# Patient Record
Sex: Female | Born: 1950 | Race: White | Hispanic: No | Marital: Married | Smoking: Never smoker
Health system: Southern US, Community
[De-identification: ages and names within clinical notes are randomized; demographics above are authoritative.]

## PROBLEM LIST (undated history)

## (undated) DIAGNOSIS — G4733 Obstructive sleep apnea (adult) (pediatric): Secondary | ICD-10-CM

## (undated) DIAGNOSIS — F419 Anxiety disorder, unspecified: Secondary | ICD-10-CM

## (undated) DIAGNOSIS — I10 Essential (primary) hypertension: Secondary | ICD-10-CM

## (undated) DIAGNOSIS — K5900 Constipation, unspecified: Secondary | ICD-10-CM

## (undated) DIAGNOSIS — Z87448 Personal history of other diseases of urinary system: Secondary | ICD-10-CM

## (undated) DIAGNOSIS — E119 Type 2 diabetes mellitus without complications: Secondary | ICD-10-CM

## (undated) DIAGNOSIS — Z87442 Personal history of urinary calculi: Secondary | ICD-10-CM

## (undated) DIAGNOSIS — G629 Polyneuropathy, unspecified: Secondary | ICD-10-CM

## (undated) DIAGNOSIS — I219 Acute myocardial infarction, unspecified: Secondary | ICD-10-CM

## (undated) DIAGNOSIS — J45909 Unspecified asthma, uncomplicated: Secondary | ICD-10-CM

## (undated) DIAGNOSIS — J309 Allergic rhinitis, unspecified: Secondary | ICD-10-CM

## (undated) DIAGNOSIS — E669 Obesity, unspecified: Secondary | ICD-10-CM

## (undated) DIAGNOSIS — D649 Anemia, unspecified: Secondary | ICD-10-CM

## (undated) DIAGNOSIS — E785 Hyperlipidemia, unspecified: Secondary | ICD-10-CM

## (undated) HISTORY — PX: EYE SURGERY: SHX253

## (undated) HISTORY — DX: Hyperlipidemia, unspecified: E78.5

## (undated) HISTORY — PX: CORONARY ANGIOPLASTY: SHX604

## (undated) HISTORY — DX: Allergic rhinitis, unspecified: J30.9

## (undated) HISTORY — DX: Anxiety disorder, unspecified: F41.9

## (undated) HISTORY — DX: Obesity, unspecified: E66.9

## (undated) HISTORY — DX: Obstructive sleep apnea (adult) (pediatric): G47.33

---

## 1998-04-15 ENCOUNTER — Emergency Department (HOSPITAL_COMMUNITY): Admission: EM | Admit: 1998-04-15 | Discharge: 1998-04-15 | Payer: Self-pay | Admitting: Emergency Medicine

## 2000-07-05 ENCOUNTER — Encounter: Admission: RE | Admit: 2000-07-05 | Discharge: 2000-10-03 | Payer: Self-pay | Admitting: Internal Medicine

## 2000-09-01 ENCOUNTER — Emergency Department (HOSPITAL_COMMUNITY): Admission: EM | Admit: 2000-09-01 | Discharge: 2000-09-01 | Payer: Self-pay

## 2007-09-07 ENCOUNTER — Emergency Department (HOSPITAL_COMMUNITY): Admission: EM | Admit: 2007-09-07 | Discharge: 2007-09-07 | Payer: Self-pay | Admitting: Emergency Medicine

## 2009-10-26 ENCOUNTER — Emergency Department (HOSPITAL_COMMUNITY): Admission: EM | Admit: 2009-10-26 | Discharge: 2009-10-26 | Payer: Self-pay | Admitting: Emergency Medicine

## 2009-12-07 ENCOUNTER — Inpatient Hospital Stay (HOSPITAL_COMMUNITY): Admission: RE | Admit: 2009-12-07 | Discharge: 2009-12-11 | Payer: Self-pay | Admitting: Interventional Cardiology

## 2009-12-29 ENCOUNTER — Observation Stay (HOSPITAL_COMMUNITY)
Admission: RE | Admit: 2009-12-29 | Discharge: 2009-12-30 | Payer: Self-pay | Source: Home / Self Care | Admitting: Interventional Cardiology

## 2010-03-23 ENCOUNTER — Encounter (HOSPITAL_COMMUNITY)
Admission: RE | Admit: 2010-03-23 | Discharge: 2010-05-16 | Payer: Self-pay | Source: Home / Self Care | Attending: Interventional Cardiology | Admitting: Interventional Cardiology

## 2010-04-02 ENCOUNTER — Inpatient Hospital Stay (HOSPITAL_COMMUNITY)
Admission: EM | Admit: 2010-04-02 | Discharge: 2010-04-03 | Payer: Self-pay | Source: Home / Self Care | Attending: Interventional Cardiology | Admitting: Interventional Cardiology

## 2010-04-21 ENCOUNTER — Encounter
Admission: RE | Admit: 2010-04-21 | Discharge: 2010-04-21 | Payer: Self-pay | Source: Home / Self Care | Attending: Internal Medicine | Admitting: Internal Medicine

## 2010-05-01 LAB — GLUCOSE, CAPILLARY
Glucose-Capillary: 204 mg/dL — ABNORMAL HIGH (ref 70–99)
Glucose-Capillary: 145 mg/dL — ABNORMAL HIGH (ref 70–99)

## 2010-05-10 LAB — GLUCOSE, CAPILLARY: Glucose-Capillary: 156 mg/dL — ABNORMAL HIGH (ref 70–99)

## 2010-05-17 ENCOUNTER — Encounter (HOSPITAL_COMMUNITY): Payer: Self-pay

## 2010-05-19 ENCOUNTER — Encounter (HOSPITAL_COMMUNITY): Payer: Self-pay

## 2010-05-22 ENCOUNTER — Other Ambulatory Visit: Payer: Self-pay

## 2010-05-22 ENCOUNTER — Encounter (HOSPITAL_COMMUNITY): Payer: Self-pay | Attending: Interventional Cardiology

## 2010-05-22 DIAGNOSIS — G4733 Obstructive sleep apnea (adult) (pediatric): Secondary | ICD-10-CM | POA: Insufficient documentation

## 2010-05-22 DIAGNOSIS — Z5189 Encounter for other specified aftercare: Secondary | ICD-10-CM | POA: Insufficient documentation

## 2010-05-22 DIAGNOSIS — I2510 Atherosclerotic heart disease of native coronary artery without angina pectoris: Secondary | ICD-10-CM | POA: Insufficient documentation

## 2010-05-22 DIAGNOSIS — Z9861 Coronary angioplasty status: Secondary | ICD-10-CM | POA: Insufficient documentation

## 2010-05-22 DIAGNOSIS — E119 Type 2 diabetes mellitus without complications: Secondary | ICD-10-CM | POA: Insufficient documentation

## 2010-05-22 DIAGNOSIS — I10 Essential (primary) hypertension: Secondary | ICD-10-CM | POA: Insufficient documentation

## 2010-05-22 DIAGNOSIS — E785 Hyperlipidemia, unspecified: Secondary | ICD-10-CM | POA: Insufficient documentation

## 2010-05-22 DIAGNOSIS — E669 Obesity, unspecified: Secondary | ICD-10-CM | POA: Insufficient documentation

## 2010-05-22 DIAGNOSIS — Z79899 Other long term (current) drug therapy: Secondary | ICD-10-CM | POA: Insufficient documentation

## 2010-05-22 DIAGNOSIS — I252 Old myocardial infarction: Secondary | ICD-10-CM | POA: Insufficient documentation

## 2010-05-23 LAB — GLUCOSE, CAPILLARY: Glucose-Capillary: 229 mg/dL — ABNORMAL HIGH (ref 70–99)

## 2010-05-24 ENCOUNTER — Encounter (HOSPITAL_COMMUNITY): Payer: Self-pay

## 2010-05-26 ENCOUNTER — Encounter (HOSPITAL_COMMUNITY): Payer: Self-pay

## 2010-05-27 NOTE — H&P (Signed)
Kimberly Hardin, Kimberly Hardin NO.:  [id number]  MEDICAL RECORD NO.:  [id number]          PATIENT TYPE:  INP  LOCATION:  2028                         FACILITY:  [facility]  PHYSICIAN:  Sarajane Marek, MD     DATE OF BIRTH:  23-Nov-1950  DATE OF ADMISSION:  04/02/2010 DATE OF DISCHARGE:                             HISTORY & PHYSICAL   PRIMARY CARDIOLOGIST:  Lyn Records, MD  PRIMARY PHYSICIAN:  Redge Gainer. Perini, MD  CHIEF COMPLAINT:  Chest pain.  HISTORY OF PRESENT ILLNESS:  Kimberly Hardin is a 60 year old female with history of coronary artery disease status post PCI x2 as detailed below, presenting with chest pain and palpitations this afternoon.  She reports that the sensation was like an esophageal spasm, though this episode lasted longer, which caused her to panic and brought the pain back after initially resolving with nitroglycerin.  She did not have pressure shortness of breath or nausea as she had had in August with her prior heart attack.  Onset occurred after eating and did resolve initially with nitroglycerin, though soon returned in association with anxiety.  She then took more nitroglycerin as well as Ativan and did have relief, though came to the hospital for further evaluation due to concern that this may be angina.  She also reports mild reactive airway disease for 3 days that she feels is secondary to her neighbor burning leaves and has required her to use her Claritin and albuterol inhalers several times this week.  In the emergency department, she currently has received no medications as she took nitroglycerin and aspirin at home, she was about to be started on heparin drip.  ALLERGIES: 1. PENICILLIN. 2. PREDNISONE. 3. DARVON. 4. BETADINE.  MEDICATIONS: 1. Coenzyme Q10 200 mg daily. 2. Proventil inhaler 1 puff q.6 h as needed. 3. Phenergan 25 mg tablet q.6 h p.r.n. 4. NovoLog insulin 6 units subcu q.a.c. 5. Aspirin 325 mg daily. 6. Coreg 6.25 mg  p.o. b.i.d. 7. Lantus 30 units subcu nightly. 8. Metformin 1000 mg p.o. b.i.d. 9. Multivitamin 1 tablet daily. 10.Plavix 75 mg daily. 11.Nitrostat p.r.n. 12.Xanax 0.25 mg p.r.n. 13.Claritin 10 mg p.r.n.  PAST MEDICAL HISTORY.: 1. Coronary artery disease.     a.     Anterior ST elevation MI, August 2011, status post 2.75 x 18-      mm Vision bare-metal sent to the proximal LAD, ejection fraction      at that time 50% with mid and distal anterior hypokinesis     b.     September 2011, 3.0 x 18-mm Promus drug-eluting stent to the      mid left circumflex 2..  Diabetes mellitus. 1. Hypertension. 2. Hyperlipidemia. 3. Panic disorder.  SOCIAL HISTORY:  The patient lives with her husband and denies alcohol or tobacco use.  FAMILY HISTORY:  Her mother is deceased at age 68.  She has a history of carotid artery disease.  Her father is 25 with coronary disease status post bypass.  REVIEW OF SYSTEMS:  Complete review of systems was performed and was negative except as noted in HPI.  PHYSICAL EXAMINATION:  VITAL  SIGNS:  Temperature is 98.4, heart rate 79, respiratory rate 18, blood pressure 150/85, oxygen saturation 98% on room air. GENERAL:  This is a very pleasant obese female in no acute distress. EYES:  Extraocular muscles intact.  Sclerae are clear. ENT: Oropharynx is clear.  Nares are clear.  Mucous membranes are moist. NECK:  Supple without lymphadenopathy, no thyromegaly, bruit or JVD. CARDIOVASCULAR:  Heart rate is regular with normal S1 and S2.  No S3 or S4.  There is a 3/6 systolic murmur at the right and left sternal borders, unable to palpate PMI secondary to body habitus.  Pulses are 2+ and equal without bruits x4 extremities. RESPIRATORY:  Clear bilaterally without increased work of breathing.  No wheezes, rhonchi, or rales.  No tachypnea. SKIN::  There is mild venous stasis changes bilaterally in the anterior tibial surface. ABDOMEN:  Soft and nontender with normal  bowel sounds.  Exam is limited by obesity. EXTREMITIES:  There is 1+ symmetric bilateral lower extremity edema below the knee.  No clubbing or cyanosis. MUSCULOSKELETAL:  No joint deformity. NEUROLOGIC:  The patient is alert and oriented x3 with cranial nerves grossly intact. PSYCHIATRIC:  The patient is anxious, but otherwise has an appropriate disposition.  Chest x-ray is pending at this time.  EKG demonstrates normal sinus rhythm at a rate of 77 with a prior septal infarct.  There are normal QRS and QTc intervals.  There is evidence of inferolateral ST depression and T-wave inversions.  Prior anterior T- wave inversions have resolved.  LABORATORY DATA:  Has been reviewed.  Pertinent data includes hemoglobin of 11.7, platelets of 217.  Sodium 142, potassium 4.3, creatinine 0.81, glucose is 263.  INR is 0.89, PTT is 27.  CK 104, CK-MB 3.0, troponin 0.12.  ASSESSMENT: 1. Chest pain. 2. Elevated troponin, consider acute coronary syndrome. 3. Hypertension. 4. Diabetes. 5. Hyperlipidemia. 6. Anxiety disorder.  PLAN: 1. Chest pain:  The patient will be admitted for ischemic evaluation.     She is currently chest pain free.  We will continue her home     aspirin, Plavix, Coreg and pravastatin and start a heparin drip.     Nitroglycerin not indicated at this time as chest pain is resolved.     We will continue p.r.n. nitroglycerin as needed.  We will follow     serial cardiac biomarkers, monitor her on telemetry and follow     serial EKGs.  In the morning, we will obtain fasting lipids, a TSH,     and a BNP.  She will be kept n.p.o. after midnight for possible     procedures pending the outcome of cardiac biomarkers.  We will     follow strict Is and Os, daily weights, maintain potassium greater     than 4, magnesium greater than 2.  We will also obtain a D-dimer to     evaluate for pulmonary embolus.  Also consider esophageal spasm or     anxiety disorder as the etiology of  chest discomfort.  We will     provide p.r.n. Xanax as well as H2 blocker. 2. Diabetes mellitus:  We will check hemoglobin A1c.  Give half dose     insulin to night and hold NovoLog until she is no longer n.p.o.     Provide sliding scale insulin coverage q.a.c. at bedtime.  Hold     metformin 3. Hypertension:  Continue home medication regimen as above and     titrate as needed for control. 4.  Prophylaxis.  She is on heparin drip.  We will also provide     ranitidine given esophageal symptoms.     Sarajane Marek, MD     HH/MEDQ  D:  04/02/2010  T:  04/03/2010  Job:  [id number]  Electronically Signed by Sarajane Marek MD on 05/27/2010 05:35:52 PM

## 2010-05-29 ENCOUNTER — Other Ambulatory Visit: Payer: Self-pay

## 2010-05-29 ENCOUNTER — Encounter (HOSPITAL_COMMUNITY): Payer: Self-pay

## 2010-05-29 LAB — GLUCOSE, CAPILLARY: Glucose-Capillary: 196 mg/dL — ABNORMAL HIGH (ref 70–99)

## 2010-05-31 ENCOUNTER — Encounter (HOSPITAL_COMMUNITY): Payer: Self-pay

## 2010-06-02 ENCOUNTER — Encounter (HOSPITAL_COMMUNITY): Payer: Self-pay

## 2010-06-05 ENCOUNTER — Encounter (HOSPITAL_COMMUNITY): Payer: Self-pay

## 2010-06-07 ENCOUNTER — Encounter (HOSPITAL_COMMUNITY): Payer: Self-pay

## 2010-06-09 ENCOUNTER — Encounter (HOSPITAL_COMMUNITY): Payer: Self-pay

## 2010-06-12 ENCOUNTER — Encounter (HOSPITAL_COMMUNITY): Payer: Self-pay

## 2010-06-14 ENCOUNTER — Other Ambulatory Visit: Payer: Self-pay

## 2010-06-14 ENCOUNTER — Encounter (HOSPITAL_COMMUNITY): Payer: Self-pay

## 2010-06-15 LAB — GLUCOSE, CAPILLARY: Glucose-Capillary: 223 mg/dL — ABNORMAL HIGH (ref 70–99)

## 2010-06-16 ENCOUNTER — Encounter (HOSPITAL_COMMUNITY): Payer: Self-pay

## 2010-06-19 ENCOUNTER — Encounter (HOSPITAL_COMMUNITY): Payer: Self-pay

## 2010-06-21 ENCOUNTER — Encounter (HOSPITAL_COMMUNITY): Payer: Self-pay

## 2010-06-23 ENCOUNTER — Encounter (HOSPITAL_COMMUNITY): Payer: Self-pay

## 2010-06-26 ENCOUNTER — Encounter (HOSPITAL_COMMUNITY): Payer: Self-pay

## 2010-06-26 LAB — CBC
HCT: 33.7 % — ABNORMAL LOW (ref 36.0–46.0)
HCT: 37.2 % (ref 36.0–46.0)
Hemoglobin: 11.7 g/dL — ABNORMAL LOW (ref 12.0–15.0)
MCH: 28.5 pg (ref 26.0–34.0)
MCHC: 31.5 g/dL (ref 30.0–36.0)
MCHC: 32.6 g/dL (ref 30.0–36.0)
MCV: 90.1 fL (ref 78.0–100.0)
MCV: 90.5 fL (ref 78.0–100.0)
Platelets: 202 10*3/uL (ref 150–400)
Platelets: 217 10*3/uL (ref 150–400)
RBC: 4.11 MIL/uL (ref 3.87–5.11)
RDW: 13.6 % (ref 11.5–15.5)
RDW: 13.8 % (ref 11.5–15.5)
WBC: 6.1 10*3/uL (ref 4.0–10.5)
WBC: 7 10*3/uL (ref 4.0–10.5)

## 2010-06-26 LAB — PROTIME-INR
INR: 0.89 (ref 0.00–1.49)
Prothrombin Time: 12.2 seconds (ref 11.6–15.2)

## 2010-06-26 LAB — CARDIAC PANEL(CRET KIN+CKTOT+MB+TROPI)
CK, MB: 2.3 ng/mL (ref 0.3–4.0)
Relative Index: INVALID (ref 0.0–2.5)
Relative Index: INVALID (ref 0.0–2.5)
Total CK: 80 U/L (ref 7–177)
Troponin I: 0.12 ng/mL — ABNORMAL HIGH (ref 0.00–0.06)

## 2010-06-26 LAB — HEPARIN LEVEL (UNFRACTIONATED)
Heparin Unfractionated: 0.15 IU/mL — ABNORMAL LOW (ref 0.30–0.70)
Heparin Unfractionated: <0.1 IU/mL — ABNORMAL LOW (ref 0.30–0.70)

## 2010-06-26 LAB — CK TOTAL AND CKMB (NOT AT ARMC)
CK, MB: 3 ng/mL (ref 0.3–4.0)
Relative Index: 2.9 — ABNORMAL HIGH (ref 0.0–2.5)
Total CK: 104 U/L (ref 7–177)

## 2010-06-26 LAB — DIFFERENTIAL
Basophils Absolute: 0 10*3/uL (ref 0.0–0.1)
Basophils Relative: 0 % (ref 0–1)
Eosinophils Absolute: 0.2 10*3/uL (ref 0.0–0.7)
Eosinophils Relative: 2 % (ref 0–5)
Lymphocytes Relative: 25 % (ref 12–46)
Lymphs Abs: 1.8 10*3/uL (ref 0.7–4.0)
Monocytes Absolute: 0.7 10*3/uL (ref 0.1–1.0)
Monocytes Relative: 10 % (ref 3–12)
Neutro Abs: 4.3 10*3/uL (ref 1.7–7.7)
Neutrophils Relative %: 62 % (ref 43–77)

## 2010-06-26 LAB — GLUCOSE, CAPILLARY
Glucose-Capillary: 150 mg/dL — ABNORMAL HIGH (ref 70–99)
Glucose-Capillary: 180 mg/dL — ABNORMAL HIGH (ref 70–99)
Glucose-Capillary: 226 mg/dL — ABNORMAL HIGH (ref 70–99)
Glucose-Capillary: 230 mg/dL — ABNORMAL HIGH (ref 70–99)
Glucose-Capillary: 255 mg/dL — ABNORMAL HIGH (ref 70–99)
Glucose-Capillary: 262 mg/dL — ABNORMAL HIGH (ref 70–99)
Glucose-Capillary: 85 mg/dL (ref 70–99)

## 2010-06-26 LAB — BASIC METABOLIC PANEL
BUN: 21 mg/dL (ref 6–23)
CO2: 26 mEq/L (ref 19–32)
Calcium: 9.6 mg/dL (ref 8.4–10.5)
Chloride: 109 mEq/L (ref 96–112)
Creatinine, Ser: 0.81 mg/dL (ref 0.4–1.2)
GFR calc Af Amer: >60 mL/min (ref >60)
GFR calc non Af Amer: >60 mL/min (ref >60)
Glucose, Bld: 263 mg/dL — ABNORMAL HIGH (ref 70–99)
Potassium: 4.3 mEq/L (ref 3.5–5.1)
Sodium: 142 mEq/L (ref 135–145)

## 2010-06-26 LAB — HEMOGLOBIN A1C
Hgb A1c MFr Bld: 7.9 % — ABNORMAL HIGH (ref <5.7)
Mean Plasma Glucose: 180 mg/dL — ABNORMAL HIGH (ref <117)

## 2010-06-26 LAB — BRAIN NATRIURETIC PEPTIDE: Pro B Natriuretic peptide (BNP): 177 pg/mL — ABNORMAL HIGH (ref 0.0–100.0)

## 2010-06-26 LAB — TSH: TSH: 0.976 u[IU]/mL (ref 0.350–4.500)

## 2010-06-26 LAB — APTT: aPTT: 27 seconds (ref 24–37)

## 2010-06-26 LAB — TROPONIN I: Troponin I: 0.12 ng/mL — ABNORMAL HIGH (ref 0.00–0.06)

## 2010-06-28 ENCOUNTER — Encounter (HOSPITAL_COMMUNITY): Payer: Self-pay

## 2010-06-29 LAB — CBC
HCT: 35.9 % — ABNORMAL LOW (ref 36.0–46.0)
HCT: 37.9 % (ref 36.0–46.0)
Hemoglobin: 12.3 g/dL (ref 12.0–15.0)
Hemoglobin: 13.1 g/dL (ref 12.0–15.0)
MCH: 28.5 pg (ref 26.0–34.0)
MCH: 29 pg (ref 26.0–34.0)
MCHC: 32.3 g/dL (ref 30.0–36.0)
MCHC: 32.5 g/dL (ref 30.0–36.0)
MCHC: 33.2 g/dL (ref 30.0–36.0)
MCV: 88.2 fL (ref 78.0–100.0)
MCV: 89.4 fL (ref 78.0–100.0)
RBC: 4.24 MIL/uL (ref 3.87–5.11)
RDW: 13 % (ref 11.5–15.5)
WBC: 5.5 10*3/uL (ref 4.0–10.5)
WBC: 8.3 10*3/uL (ref 4.0–10.5)

## 2010-06-29 LAB — URINALYSIS, ROUTINE W REFLEX MICROSCOPIC
Bilirubin Urine: NEGATIVE
Hgb urine dipstick: NEGATIVE
Specific Gravity, Urine: 1.025 (ref 1.005–1.030)
Urobilinogen, UA: 1 mg/dL (ref 0.0–1.0)
pH: 5 (ref 5.0–8.0)

## 2010-06-29 LAB — BASIC METABOLIC PANEL
BUN: 13 mg/dL (ref 6–23)
BUN: 22 mg/dL (ref 6–23)
CO2: 25 mEq/L (ref 19–32)
CO2: 28 mEq/L (ref 19–32)
CO2: 28 mEq/L (ref 19–32)
Chloride: 107 mEq/L (ref 96–112)
Chloride: 101 mEq/L (ref 96–112)
Chloride: 103 mEq/L (ref 96–112)
Creatinine, Ser: 0.71 mg/dL (ref 0.4–1.2)
Creatinine, Ser: 1 mg/dL (ref 0.4–1.2)
GFR calc Af Amer: >60 mL/min (ref >60)
GFR calc Af Amer: >60 mL/min (ref >60)
Glucose, Bld: 242 mg/dL — ABNORMAL HIGH (ref 70–99)
Glucose, Bld: 255 mg/dL — ABNORMAL HIGH (ref 70–99)
Potassium: 4.2 mEq/L (ref 3.5–5.1)
Potassium: 4.1 mEq/L (ref 3.5–5.1)
Potassium: 4.4 mEq/L (ref 3.5–5.1)
Sodium: 136 mEq/L (ref 135–145)

## 2010-06-29 LAB — URINE MICROSCOPIC-ADD ON

## 2010-06-29 LAB — DIFFERENTIAL
Eosinophils Absolute: 0.2 10*3/uL (ref 0.0–0.7)
Eosinophils Relative: 2 % (ref 0–5)
Lymphs Abs: 1.8 10*3/uL (ref 0.7–4.0)
Monocytes Relative: 7 % (ref 3–12)

## 2010-06-29 LAB — GLUCOSE, CAPILLARY
Glucose-Capillary: 147 mg/dL — ABNORMAL HIGH (ref 70–99)
Glucose-Capillary: 148 mg/dL — ABNORMAL HIGH (ref 70–99)
Glucose-Capillary: 310 mg/dL — ABNORMAL HIGH (ref 70–99)
Glucose-Capillary: 211 mg/dL — ABNORMAL HIGH (ref 70–99)
Glucose-Capillary: 221 mg/dL — ABNORMAL HIGH (ref 70–99)
Glucose-Capillary: 259 mg/dL — ABNORMAL HIGH (ref 70–99)
Glucose-Capillary: 289 mg/dL — ABNORMAL HIGH (ref 70–99)
Glucose-Capillary: 290 mg/dL — ABNORMAL HIGH (ref 70–99)
Glucose-Capillary: 359 mg/dL — ABNORMAL HIGH (ref 70–99)
Glucose-Capillary: 371 mg/dL — ABNORMAL HIGH (ref 70–99)

## 2010-06-29 LAB — COMPREHENSIVE METABOLIC PANEL
ALT: 20 U/L (ref 0–35)
AST: 23 U/L (ref 0–37)
CO2: 26 mEq/L (ref 19–32)
Calcium: 8.7 mg/dL (ref 8.4–10.5)
GFR calc Af Amer: >60 mL/min (ref >60)
GFR calc non Af Amer: >60 mL/min (ref >60)
Potassium: 4.2 mEq/L (ref 3.5–5.1)
Sodium: 141 mEq/L (ref 135–145)
Total Protein: 6.3 g/dL (ref 6.0–8.3)

## 2010-06-29 LAB — POCT I-STAT 3, VENOUS BLOOD GAS (G3P V)
Bicarbonate: 27.7 mEq/L — ABNORMAL HIGH (ref 20.0–24.0)
TCO2: 29 mmol/L (ref 0–100)
pCO2, Ven: 50 mmHg (ref 45.0–50.0)
pH, Ven: 7.352 — ABNORMAL HIGH (ref 7.250–7.300)

## 2010-06-29 LAB — BRAIN NATRIURETIC PEPTIDE: Pro B Natriuretic peptide (BNP): 183 pg/mL — ABNORMAL HIGH (ref 0.0–100.0)

## 2010-06-29 LAB — URINE CULTURE
Colony Count: 6000
Culture  Setup Time: 201108252109

## 2010-06-29 LAB — POCT I-STAT, CHEM 8
BUN: 18 mg/dL (ref 6–23)
Chloride: 104 mEq/L (ref 96–112)
Creatinine, Ser: 0.6 mg/dL (ref 0.4–1.2)
Sodium: 139 mEq/L (ref 135–145)
TCO2: 27 mmol/L (ref 0–100)

## 2010-06-29 LAB — CARDIAC PANEL(CRET KIN+CKTOT+MB+TROPI)
CK, MB: 178.1 ng/mL (ref 0.3–4.0)
CK, MB: 5.7 ng/mL — ABNORMAL HIGH (ref 0.3–4.0)
Relative Index: 5.1 — ABNORMAL HIGH (ref 0.0–2.5)
Relative Index: 5.5 — ABNORMAL HIGH (ref 0.0–2.5)
Relative Index: 9.4 — ABNORMAL HIGH (ref 0.0–2.5)
Total CK: 114 U/L (ref 7–177)
Total CK: 1901 U/L — ABNORMAL HIGH (ref 7–177)
Total CK: 977 U/L — ABNORMAL HIGH (ref 7–177)
Troponin I: 50.39 ng/mL (ref 0.00–0.06)

## 2010-06-29 LAB — LIPID PANEL
HDL: 46 mg/dL (ref >39)
LDL Cholesterol: 143 mg/dL — ABNORMAL HIGH (ref 0–99)
Triglycerides: 102 mg/dL (ref <150)
VLDL: 20 mg/dL (ref 0–40)

## 2010-06-29 LAB — PROTIME-INR: Prothrombin Time: 12.1 seconds (ref 11.6–15.2)

## 2010-06-30 ENCOUNTER — Encounter (HOSPITAL_COMMUNITY): Payer: Self-pay

## 2010-07-02 LAB — POCT I-STAT, CHEM 8
Chloride: 105 mEq/L (ref 96–112)
Glucose, Bld: 238 mg/dL — ABNORMAL HIGH (ref 70–99)
HCT: 45 % (ref 36.0–46.0)
Hemoglobin: 15.3 g/dL — ABNORMAL HIGH (ref 12.0–15.0)
Potassium: 4.2 mEq/L (ref 3.5–5.1)
Sodium: 136 mEq/L (ref 135–145)

## 2010-07-02 LAB — BASIC METABOLIC PANEL
BUN: 18 mg/dL (ref 6–23)
GFR calc Af Amer: >60 mL/min (ref >60)
GFR calc non Af Amer: >60 mL/min (ref >60)
Potassium: 4.2 mEq/L (ref 3.5–5.1)
Sodium: 137 mEq/L (ref 135–145)

## 2010-07-02 LAB — GLUCOSE, CAPILLARY: Glucose-Capillary: 231 mg/dL — ABNORMAL HIGH (ref 70–99)

## 2010-07-03 ENCOUNTER — Encounter (HOSPITAL_COMMUNITY): Payer: Self-pay

## 2010-07-05 ENCOUNTER — Encounter (HOSPITAL_COMMUNITY): Payer: Self-pay

## 2010-07-07 ENCOUNTER — Encounter (HOSPITAL_COMMUNITY): Payer: Self-pay

## 2010-07-10 ENCOUNTER — Encounter (HOSPITAL_COMMUNITY): Payer: Self-pay

## 2010-07-12 ENCOUNTER — Encounter (HOSPITAL_COMMUNITY): Payer: Self-pay

## 2010-07-14 ENCOUNTER — Encounter (HOSPITAL_COMMUNITY): Payer: Self-pay

## 2010-07-17 ENCOUNTER — Encounter (HOSPITAL_COMMUNITY): Payer: Self-pay

## 2010-07-19 ENCOUNTER — Encounter (HOSPITAL_COMMUNITY): Payer: Self-pay

## 2010-07-21 ENCOUNTER — Encounter (HOSPITAL_COMMUNITY): Payer: Self-pay

## 2010-07-24 ENCOUNTER — Encounter (HOSPITAL_COMMUNITY): Payer: Self-pay

## 2010-07-26 ENCOUNTER — Encounter (HOSPITAL_COMMUNITY): Payer: Self-pay

## 2010-07-28 ENCOUNTER — Encounter (HOSPITAL_COMMUNITY): Payer: Self-pay

## 2010-07-31 ENCOUNTER — Encounter (HOSPITAL_COMMUNITY): Payer: Self-pay

## 2010-08-02 ENCOUNTER — Encounter (HOSPITAL_COMMUNITY): Payer: Self-pay

## 2010-08-04 ENCOUNTER — Encounter (HOSPITAL_COMMUNITY): Payer: Self-pay

## 2010-08-07 ENCOUNTER — Encounter (HOSPITAL_COMMUNITY): Payer: Self-pay

## 2010-09-06 ENCOUNTER — Ambulatory Visit
Admission: RE | Admit: 2010-09-06 | Discharge: 2010-09-06 | Disposition: A | Discharge status: Home / Self Care | Payer: 59 | Source: Ambulatory Visit | Attending: Internal Medicine | Admitting: Internal Medicine

## 2010-09-06 ENCOUNTER — Other Ambulatory Visit: Payer: Self-pay | Admitting: Internal Medicine

## 2010-09-06 DIAGNOSIS — M25559 Pain in unspecified hip: Secondary | ICD-10-CM

## 2010-09-14 ENCOUNTER — Ambulatory Visit (HOSPITAL_COMMUNITY)
Admission: RE | Admit: 2010-09-14 | Discharge: 2010-09-15 | Disposition: A | Discharge status: Home / Self Care | Payer: 59 | Source: Ambulatory Visit | Attending: Interventional Cardiology | Admitting: Interventional Cardiology

## 2010-09-14 DIAGNOSIS — Z9861 Coronary angioplasty status: Secondary | ICD-10-CM | POA: Insufficient documentation

## 2010-09-14 DIAGNOSIS — Z794 Long term (current) use of insulin: Secondary | ICD-10-CM | POA: Insufficient documentation

## 2010-09-14 DIAGNOSIS — Z7982 Long term (current) use of aspirin: Secondary | ICD-10-CM | POA: Insufficient documentation

## 2010-09-14 DIAGNOSIS — E119 Type 2 diabetes mellitus without complications: Secondary | ICD-10-CM | POA: Insufficient documentation

## 2010-09-14 DIAGNOSIS — E669 Obesity, unspecified: Secondary | ICD-10-CM | POA: Insufficient documentation

## 2010-09-14 DIAGNOSIS — I252 Old myocardial infarction: Secondary | ICD-10-CM | POA: Insufficient documentation

## 2010-09-14 DIAGNOSIS — I10 Essential (primary) hypertension: Secondary | ICD-10-CM | POA: Insufficient documentation

## 2010-09-14 DIAGNOSIS — E785 Hyperlipidemia, unspecified: Secondary | ICD-10-CM | POA: Insufficient documentation

## 2010-09-14 DIAGNOSIS — Y849 Medical procedure, unspecified as the cause of abnormal reaction of the patient, or of later complication, without mention of misadventure at the time of the procedure: Secondary | ICD-10-CM | POA: Insufficient documentation

## 2010-09-14 DIAGNOSIS — Z79899 Other long term (current) drug therapy: Secondary | ICD-10-CM | POA: Insufficient documentation

## 2010-09-14 DIAGNOSIS — I2510 Atherosclerotic heart disease of native coronary artery without angina pectoris: Secondary | ICD-10-CM | POA: Insufficient documentation

## 2010-09-14 DIAGNOSIS — I2582 Chronic total occlusion of coronary artery: Secondary | ICD-10-CM | POA: Insufficient documentation

## 2010-09-14 DIAGNOSIS — F411 Generalized anxiety disorder: Secondary | ICD-10-CM | POA: Insufficient documentation

## 2010-09-14 DIAGNOSIS — Z7902 Long term (current) use of antithrombotics/antiplatelets: Secondary | ICD-10-CM | POA: Insufficient documentation

## 2010-09-14 LAB — GLUCOSE, CAPILLARY
Glucose-Capillary: 170 mg/dL — ABNORMAL HIGH (ref 70–99)
Glucose-Capillary: 107 mg/dL — ABNORMAL HIGH (ref 70–99)

## 2010-09-15 LAB — CBC
HCT: 37.3 % (ref 36.0–46.0)
Hemoglobin: 12.3 g/dL (ref 12.0–15.0)
MCHC: 33 g/dL (ref 30.0–36.0)
MCV: 89 fL (ref 78.0–100.0)

## 2010-09-15 LAB — BASIC METABOLIC PANEL
CO2: 25 mEq/L (ref 19–32)
Calcium: 9.1 mg/dL (ref 8.4–10.5)
Glucose, Bld: 175 mg/dL — ABNORMAL HIGH (ref 70–99)
Potassium: 4.4 mEq/L (ref 3.5–5.1)
Sodium: 136 mEq/L (ref 135–145)

## 2010-09-15 LAB — GLUCOSE, CAPILLARY: Glucose-Capillary: 185 mg/dL — ABNORMAL HIGH (ref 70–99)

## 2010-10-19 NOTE — Cardiovascular Report (Signed)
Kimberly Hardin, Kimberly Hardin NO.:  [id number]  MEDICAL RECORD NO.:  [id number]           PATIENT TYPE:  O  LOCATION:  6523                         FACILITY:  [facility]  PHYSICIAN:  Lyn Records, M.D.   DATE OF BIRTH:  [birth year]  DATE OF PROCEDURE:  09/14/2010 DATE OF DISCHARGE:                           CARDIAC CATHETERIZATION   REASON FOR PROCEDURE:  Recurrent angina after previous anterior myocardial infarction treated with bare metal stent to the left anterior descending and drug-eluting stent to the circumflex in August and September 2011 respectively.  PROCEDURES PERFORMED: 1. Left heart catheterization via right radial approach. 2. Coronary angiography. 3. Left ventriculography. 4. Drug-eluting stent of left anterior descending total occlusion due     to in-stent restenosis.  OPERATOR:  Lyn Records, MD  DESCRIPTION OF PROCEDURE:  With some difficulty, we were able to cannulate the right radial using a 5-French sheath in the modified Seldinger technique.  We then used a 5-French A2 multipurpose catheter over a VersaFlex wire to enter the ascending aorta.  We then used multipurpose catheter to perform left and right coronary angiography and left ventriculography.  We identified total occlusion of the LAD due to in-stent restenosis.  We gave 50 units/kg of heparin after inserting the radial sheath.  We decided to perform PCI and gave an additional 2500 units of heparin and documented an ACT greater than 300 seconds.  The patient has been on Plavix.  We then used an EBU 5-French guide catheter after removing the multipurpose catheter.  A Prowater wire was then used to probe the region of total occlusion within the previously placed stent.  This wire crossed very easily.  We then used a 2.0 x 15 mm long TREK balloon. Several balloon inflations were performed.  We then stented the mid-to- proximal LAD with a 2.25 x 30 mm long Resolute stent and overlapped  in the proximal margin of the stent with an additional 8 mm of 2.25 mm Resolute stent.  We postdilated using a 2.5 x 20 mm long [place] TREK post dilating to 12 atmospheres throughout the stent region.  Postprocedure, there was TIMI grade 3 flow.  The ostial/proximal LAD beyond the stent was moderately to severely diseased but unchanged from the prior appearance after initial stenting in August 2011.  The lesion is eccentric in this area and has between 50-60%.  The total territory of the LAD is relatively small.  A bolus followed by an infusion of Integrilin was started.  Integrilin will be continued for a total of 12-18 hours.  The sheath was removed and a wristband applied with good hemostasis.  RESULTS: 1. Hemodynamic data:     a.     Aortic pressure 165/81 mmHg.     b.     Left ventricular pressure 173/31 mmHg. 2. Left ventriculography:  The mid anterior wall is severely     hypokinetic.  The estimated ejection fraction is 40-45%. 3. Coronary angiography.     a.     Left main coronary:  The left main is long and widely      patent.  b.     Left anterior descending coronary:  The LAD is totally      occluded proximally.  The total occlusion is within the previously      placed stent.     c.     Circumflex artery:  This is a large vessel that gives origin      to 3 obtuse marginal branches.  There is 30-50% narrowing in the      proximal segment of the first marginal.  Second marginal is large      and widely patent.  The third marginal is large and contains a      proximal 60% narrowing.     d.     Right coronary:  The right coronary artery collateralizes      the left coronary.  The right coronary gives a large PDA and left      ventricular branch.  Irregularities are noted in the mid PDA with      up to 60% narrowing.  Ostium of the right coronary contains 40-50%      narrowing. 4. PCI:  The totally occluded RCA due to ISR was recanalized and     stented as noted above from  100% to 0% with TIMI grade 3 flow.  CONCLUSIONS: 1. Successful recanalization and stenting of a totally occluded left     anterior descending due to in-stent restenosis with reduction in     stenosis to 0% using resolute drug eluting stents overlapping from     mid-to-proximal left anterior descending. 2. Widely patent drug-eluting stent mid circumflex with mild-to-     moderate disease involving the obtuse marginals. 3. The right coronary is dominant and widely patent. 4. Left ventricular dysfunction with mid-to-distal anterior wall     severe hypokinesis and ejection fraction of 40-45%.  PLAN:  Aspirin and indefinite Plavix therapy.  Integrilin will be continued for 12-18 hours.  Plan to discharge on September 15, 2010.     Lyn Records, M.D.     HWS/MEDQ  D:  09/14/2010  T:  09/15/2010  Job:  [id number]  cc:   Loraine Leriche A. Perini, M.D.  Electronically Signed by Verdis Prime M.D. on 10/19/2010 01:35:07 PM

## 2010-10-19 NOTE — Discharge Summary (Signed)
NAMEKENNEDY, Kimberly Hardin NO.:  [id number]  MEDICAL RECORD NO.:  [id number]           PATIENT TYPE:  O  LOCATION:  6523                         FACILITY:  [facility]  PHYSICIAN:  Lyn Records, M.D.   DATE OF BIRTH:  1950/08/27  DATE OF ADMISSION:  09/14/2010 DATE OF DISCHARGE:  09/15/2010                              DISCHARGE SUMMARY   REASON FOR PROCEDURE:  Recurrent angina.  Prior history of the LAD and circumflex stent.  LAD stent is bare metal.  DISCHARGE DIAGNOSES: 1. Coronary atherosclerotic heart disease.     a.     Bare metal stent of left anterior descending artery in      August 2011, during acute anterior wall ST-elevation myocardial      infarction, now with total occlusion due to in-stent restenosis.     b.     Circumflex drug-eluting stent in September 2011, widely      patent.     c.     Left anterior descending artery re-stenting with drug-      eluting stent on Sep 14, 2010. 2. Obesity. 3. Diabetes. 4. Hypertension. 5. Anxiety disorder. 6. Hyperlipidemia.  PROCEDURES PERFORMED: 1. Left heart catheterization, 2. Coronary angiography. 3. Drug-eluting stenting of the LAD via right radial approach.  DISCHARGE INSTRUCTIONS: 1. Medications:  No changes from admission and include the following:     a.     Aspirin 325 mg per day.     b.     Plavix 75 mg per day.     c.     Benicar 20 mg per day.     d.     Carvedilol 6.25 mg twice per day.     e.     CoQ10, 200 mg daily.     f.     Lantus insulin 45 units at bedtime.     g.     Lexapro 5 mg daily.     h.     Loratadine 10 mg daily as needed.     i.     Metformin 1000 mg twice daily.     j.     Multiple vitamins 1 daily.     k.     Nitroglycerin 0.4 mg sublingually as needed.     l.     NovoLog 9 units subcutaneously 3 times per day with meals.     m.     Ondansetron 8 mg every 8 hours.     n.     Pravastatin 20 mg per day.     o.     Proventil 1 puff every 6 hours. xvi.  Rosemary oil  topically daily. xvii.  Vitamin D3, 1000 units daily. xviii.  Xanax 0.25 mg twice a day as needed. 1. Activity:  Unrestricted, return to work on September 22, 2010. 2. Diet:  Heart healthy.  HISTORY, PHYSICAL AND HOSPITAL COURSE:  The patient began having recurrent angina.  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  Because of risk factors for restenosis including diabetes and bare metal stent  in LAD, we chose to perform catheterization to assess previously placed stents. Catheterization via the right radial approach demonstrated total occlusion of the LAD stent.  We were able to successfully recanalize the LAD and to re-stent the LAD at this time using drug-eluting stents. Please see the procedure note.  On the morning of discharge, the patient's right radial was without evidence of hematoma, drainage, or excessive trauma.  She was felt to be stable for discharge.  She will follow up with Dr. Verdis Prime in 1-2 weeks.     Lyn Records, M.D.     HWS/MEDQ  D:  09/15/2010  T:  09/15/2010  Job:  [id number]  cc:   Loraine Leriche A. Perini, M.D.  Electronically Signed by Verdis Prime M.D. on 10/19/2010 01:35:10 PM

## 2012-08-08 ENCOUNTER — Encounter (HOSPITAL_COMMUNITY): Payer: Self-pay | Admitting: *Deleted

## 2012-08-08 ENCOUNTER — Emergency Department (HOSPITAL_COMMUNITY)
Admission: EM | Admit: 2012-08-08 | Discharge: 2012-08-08 | Disposition: A | Discharge status: Home / Self Care | Payer: 59 | Attending: Emergency Medicine | Admitting: Emergency Medicine

## 2012-08-08 DIAGNOSIS — I10 Essential (primary) hypertension: Secondary | ICD-10-CM | POA: Insufficient documentation

## 2012-08-08 DIAGNOSIS — E119 Type 2 diabetes mellitus without complications: Secondary | ICD-10-CM | POA: Insufficient documentation

## 2012-08-08 DIAGNOSIS — R1032 Left lower quadrant pain: Secondary | ICD-10-CM | POA: Insufficient documentation

## 2012-08-08 DIAGNOSIS — I252 Old myocardial infarction: Secondary | ICD-10-CM | POA: Insufficient documentation

## 2012-08-08 DIAGNOSIS — R109 Unspecified abdominal pain: Secondary | ICD-10-CM

## 2012-08-08 DIAGNOSIS — E875 Hyperkalemia: Secondary | ICD-10-CM | POA: Insufficient documentation

## 2012-08-08 DIAGNOSIS — R1110 Vomiting, unspecified: Secondary | ICD-10-CM | POA: Insufficient documentation

## 2012-08-08 DIAGNOSIS — K5900 Constipation, unspecified: Secondary | ICD-10-CM

## 2012-08-08 DIAGNOSIS — Z794 Long term (current) use of insulin: Secondary | ICD-10-CM | POA: Insufficient documentation

## 2012-08-08 DIAGNOSIS — Z79899 Other long term (current) drug therapy: Secondary | ICD-10-CM | POA: Insufficient documentation

## 2012-08-08 HISTORY — DX: Type 2 diabetes mellitus without complications: E11.9

## 2012-08-08 HISTORY — DX: Acute myocardial infarction, unspecified: I21.9

## 2012-08-08 HISTORY — DX: Essential (primary) hypertension: I10

## 2012-08-08 LAB — URINALYSIS, ROUTINE W REFLEX MICROSCOPIC
Bilirubin Urine: NEGATIVE
Nitrite: NEGATIVE
Specific Gravity, Urine: 1.023 (ref 1.005–1.030)
Urobilinogen, UA: 0.2 mg/dL (ref 0.0–1.0)

## 2012-08-08 LAB — COMPREHENSIVE METABOLIC PANEL
Alkaline Phosphatase: 69 U/L (ref 39–117)
BUN: 20 mg/dL (ref 6–23)
Calcium: 10.3 mg/dL (ref 8.4–10.5)
GFR calc Af Amer: 88 mL/min — ABNORMAL LOW (ref >90)
Glucose, Bld: 284 mg/dL — ABNORMAL HIGH (ref 70–99)
Total Protein: 8 g/dL (ref 6.0–8.3)

## 2012-08-08 LAB — CBC WITH DIFFERENTIAL/PLATELET
Eosinophils Absolute: 0.2 10*3/uL (ref 0.0–0.7)
Eosinophils Relative: 2 % (ref 0–5)
Hemoglobin: 13.1 g/dL (ref 12.0–15.0)
Lymphs Abs: 2 10*3/uL (ref 0.7–4.0)
MCH: 30 pg (ref 26.0–34.0)
MCV: 87.6 fL (ref 78.0–100.0)
Monocytes Relative: 7 % (ref 3–12)
RBC: 4.37 MIL/uL (ref 3.87–5.11)

## 2012-08-08 LAB — LIPASE, BLOOD: Lipase: 33 U/L (ref 11–59)

## 2012-08-08 LAB — URINE MICROSCOPIC-ADD ON

## 2012-08-08 MED ORDER — KETOROLAC TROMETHAMINE 30 MG/ML IJ SOLN
30.0000 mg | Freq: Once | INTRAMUSCULAR | Status: AC
  Administered 2012-08-08: 30 mg via INTRAVENOUS
  Filled 2012-08-08: qty 1

## 2012-08-08 MED ORDER — OXYCODONE-ACETAMINOPHEN 5-325 MG PO TABS: 2.0000 | ORAL_TABLET | Freq: Once | ORAL | Status: DC

## 2012-08-08 MED ORDER — DOCUSATE SODIUM 100 MG PO CAPS: 100.0000 mg | ORAL_CAPSULE | Freq: Two times a day (BID) | ORAL | Status: DC

## 2012-08-08 MED ORDER — MORPHINE SULFATE 4 MG/ML IJ SOLN: 4.0000 mg | Freq: Once | INTRAMUSCULAR | Status: DC

## 2012-08-08 MED ORDER — NAPROXEN 500 MG PO TABS: 500.0000 mg | ORAL_TABLET | Freq: Two times a day (BID) | ORAL | Status: DC

## 2012-08-08 MED ORDER — AMOXICILLIN-POT CLAVULANATE 875-125 MG PO TABS: 1.0000 | ORAL_TABLET | Freq: Two times a day (BID) | ORAL | Status: DC

## 2012-08-08 MED ORDER — KETOROLAC TROMETHAMINE 60 MG/2ML IM SOLN: 60.0000 mg | Freq: Once | INTRAMUSCULAR | Status: DC

## 2012-08-08 MED ORDER — AMOXICILLIN-POT CLAVULANATE 875-125 MG PO TABS
1.0000 | ORAL_TABLET | Freq: Once | ORAL | Status: AC
  Administered 2012-08-08: 1 via ORAL
  Filled 2012-08-08: qty 1

## 2012-08-08 MED ORDER — ONDANSETRON 4 MG PO TBDP: 4.0000 mg | ORAL_TABLET | Freq: Three times a day (TID) | ORAL | Status: DC | PRN

## 2012-08-08 MED ORDER — SODIUM POLYSTYRENE SULFONATE 15 GM/60ML PO SUSP
30.0000 g | Freq: Once | ORAL | Status: AC
  Administered 2012-08-08: 30 g via ORAL
  Filled 2012-08-08 (×2): qty 60

## 2012-08-08 MED ORDER — POLYETHYLENE GLYCOL 3350 17 GM/SCOOP PO POWD: 17.0000 g | Freq: Two times a day (BID) | ORAL | Status: DC

## 2012-08-08 MED ORDER — SODIUM CHLORIDE 0.9 % IV BOLUS (SEPSIS)
1000.0000 mL | Freq: Once | INTRAVENOUS | Status: AC
  Administered 2012-08-08: 1000 mL via INTRAVENOUS

## 2012-08-08 MED ORDER — OXYCODONE-ACETAMINOPHEN 5-325 MG PO TABS: 1.0000 | ORAL_TABLET | ORAL | Status: DC | PRN

## 2012-08-08 NOTE — ED Notes (Signed)
PT c/o L upper quad abd pain Sat.  Constipation sin Sat with bm after mag on Wed.  Since then pain is spreading to LLQ, no bm since Wed, pt also nauseated and gaseous.

## 2012-08-08 NOTE — ED Provider Notes (Signed)
Assumed care of patient from Dr. Hyacinth Meeker at shift change. Awaiting repeat potassium.  Repeat potassium 4.9  Patient denies complaints.  Stable for discharge  BP 192/67  Pulse 74  Temp(Src) 97.9 F (36.6 C) (Oral)  Resp 14  SpO2 100%   Glynn Octave, MD 08/08/12 1739

## 2012-08-08 NOTE — ED Provider Notes (Signed)
History     CSN: [id number]  Arrival date & time 08/08/12  1330   First MD Initiated Contact with Patient 08/08/12 1351      Chief Complaint  Patient presents with  . Abdominal Pain  . Emesis    (Consider location/radiation/quality/duration/timing/severity/associated sxs/prior treatment) HPI Comments: 62 year old female with a history of hypertension and coronary artery disease as well as diabetes who presents with a complaint of left lower quadrant abdominal pain. She states that over the last week she has had several episodes of constipation requiring significant laxative use, she went several days without a bowel movement and on Wednesday had 7 bowel movements. These were hard and voluminous and after that time she felt back to herself without pain or discomfort. Over the last 2 days she has developed recurrent pain which was initially in the left upper quadrant and is now become left lower quadrant in location, is 6/10, aching pain with location in the left lower quadrant and the left side. She denies flank pain, back pain, dysuria, hematuria, rectal bleeding, nausea, vomiting or abdominal distention. She has no prior history of abdominal surgery though she has had coronary disease with stenting in the past. She denies chest pain, shortness of breath, fevers, chills, coughing, rashes or any other complaints.  Patient is a 62 y.o. female presenting with abdominal pain and vomiting. The history is provided by the patient and the spouse.  Abdominal Pain Associated symptoms: vomiting   Emesis Associated symptoms: abdominal pain     Past Medical History  Diagnosis Date  . MI (myocardial infarction)   . Diabetes mellitus without complication   . Hypertension     Past Surgical History  Procedure Laterality Date  . Carotid stent insertion      No family history on file.  History  Substance Use Topics  . Smoking status: Never Smoker   . Smokeless tobacco: Not on file  . Alcohol  Use: No    OB History   Grav Para Term Preterm Abortions TAB SAB Ect Mult Living                  Review of Systems  Gastrointestinal: Positive for vomiting and abdominal pain.  All other systems reviewed and are negative.    Allergies  Darvon; Minocycline; Prednisone; and Valium  Home Medications   Current Outpatient Rx  Name  Route  Sig  Dispense  Refill  . Amino Acids (L-CARNITINE PO)   Oral   Take 1 tablet by mouth daily.         . carvedilol (COREG) 12.5 MG tablet   Oral   Take 6.25 mg by mouth every evening.         . Cholecalciferol (VITAMIN D-3 PO)   Oral   Take 1 tablet by mouth daily.         . Coenzyme Q10 (CO Q 10 PO)   Oral   Take 1 tablet by mouth daily.         . Cyanocobalamin (VITAMIN B 12 PO)   Oral   Take 1 tablet by mouth daily.         . insulin aspart (NOVOLOG) 100 UNIT/ML injection   Subcutaneous   Inject 14 Units into the skin 3 (three) times daily with meals.         . insulin glargine (LANTUS) 100 UNIT/ML injection   Subcutaneous   Inject 22 Units into the skin at bedtime.         Marland Kitchen  LYSINE PO   Oral   Take 1 tablet by mouth daily.         . metFORMIN (GLUCOPHAGE) 1000 MG tablet   Oral   Take 1,000 mg by mouth 2 (two) times daily with a meal.         . olmesartan (BENICAR) 40 MG tablet   Oral   Take 40 mg by mouth daily.         Marland Kitchen OVER THE COUNTER MEDICATION   Oral   Take 1 tablet by mouth daily. Tumeric Supplement         . pravastatin (PRAVACHOL) 40 MG tablet   Oral   Take 20 mg by mouth daily.           BP 226/85  Pulse 92  Temp(Src) 97.9 F (36.6 C) (Oral)  Resp 22  SpO2 98%  Physical Exam  Nursing note and vitals reviewed. Constitutional: She appears well-developed and well-nourished. No distress.  HENT:  Head: Normocephalic and atraumatic.  Mouth/Throat: Oropharynx is clear and moist. No oropharyngeal exudate.  Eyes: Conjunctivae and EOM are normal. Pupils are equal, round, and  reactive to light. Right eye exhibits no discharge. Left eye exhibits no discharge. No scleral icterus.  Neck: Normal range of motion. Neck supple. No JVD present. No thyromegaly present.  Cardiovascular: Normal rate, regular rhythm, normal heart sounds and intact distal pulses.  Exam reveals no gallop and no friction rub.   No murmur heard. Pulmonary/Chest: Effort normal and breath sounds normal. No respiratory distress. She has no wheezes. She has no rales.  Abdominal: Soft. Bowel sounds are normal. She exhibits no distension and no mass. There is no tenderness.  Morbidly obese abdomen, tenderness focused in the left lower quadrant. No pain in the right lower quadrant, no upper abdominal tenderness, no guarding, no peritoneal signs  Musculoskeletal: Normal range of motion. She exhibits no edema and no tenderness.  Lymphadenopathy:    She has no cervical adenopathy.  Neurological: She is alert. Coordination normal.  Skin: Skin is warm and dry. No rash noted. No erythema.  Psychiatric: She has a normal mood and affect. Her behavior is normal.    ED Course  Procedures (including critical care time)  Labs Reviewed  CBC WITH DIFFERENTIAL  COMPREHENSIVE METABOLIC PANEL  LIPASE, BLOOD  URINALYSIS, ROUTINE W REFLEX MICROSCOPIC   No results found.   No diagnosis found.    MDM  At this time the patient appears mild to moderate discomfort in the left lower quadrant, I would consider both constipation and diverticulitis as possible etiologies however the patient has been able to eat and drink without difficulty and has no nausea vomiting or abdominal distention there is a doubt that this is a bowel obstruction. She does have a tendency to get constipated per her report and her history, at this time I have given her the option of CT scan versus medical management of a possible diverticulitis and we have come to the conclusion that it would be reasonable to wait for a CBC to see if there is a  significant leukocytosis prior to deciding on the CT scan. We'll start with pain medications including Percocet, antiemetics and reevaluate. Patient's blood pressure likely elevated in relation to her pain and anxiety of the visit.  Filed Vitals:   08/08/12 1336  BP: 226/85  Pulse: 92  Temp: 97.9 F (36.6 C)  Resp: 22   Labs showed no leukocytosis, mild hypokalemia and hyponatremia, AST of 60 but otherwise no  significant findings. Lipase is normal, urinalysis is pending at this time. The patient has declined CT scan, I currently agree with this plan, she will need her potassium rechecked, antibiotics have been ordered, pain medicines apart he started to work and that Toradol has made her feel better. I will give her Kayexalate in hopes that this will cause a bowel movement as well as helping with hyperkalemia. Of note the patient does take Benicar which can cause some elevation of the potassium  Change of shift - care signed out to Dr. Carlena Bjornstad, MD 08/08/12 [phone]

## 2012-08-08 NOTE — ED Notes (Signed)
MD at bedside. 

## 2012-08-11 ENCOUNTER — Telehealth: Payer: Self-pay | Admitting: Gastroenterology

## 2012-08-11 NOTE — Telephone Encounter (Signed)
Pt having abdominal pain, was seen in the ER recently for constipation, pain, and ?diverticulitis. Dr. Waynard Edwards requests pt be seen this week. Pt scheduled to see Doug Sou PA tomorrow at 9:15am. Malachi Bonds to fax records and notify pt of appt.

## 2012-08-12 ENCOUNTER — Encounter (HOSPITAL_COMMUNITY): Payer: Self-pay | Admitting: Emergency Medicine

## 2012-08-12 ENCOUNTER — Inpatient Hospital Stay (HOSPITAL_COMMUNITY)
Admission: EM | Admit: 2012-08-12 | Discharge: 2012-08-19 | DRG: 693 | Disposition: A | Discharge status: Home / Self Care | Payer: 59 | Attending: Family Medicine | Admitting: Family Medicine

## 2012-08-12 ENCOUNTER — Encounter (HOSPITAL_COMMUNITY)
Admission: EM | Disposition: A | Discharge status: Home / Self Care | Payer: Self-pay | Source: Home / Self Care | Attending: Internal Medicine

## 2012-08-12 ENCOUNTER — Inpatient Hospital Stay (HOSPITAL_COMMUNITY): Payer: 59 | Admitting: Registered Nurse

## 2012-08-12 ENCOUNTER — Encounter: Payer: Self-pay | Admitting: Gastroenterology

## 2012-08-12 ENCOUNTER — Ambulatory Visit (INDEPENDENT_AMBULATORY_CARE_PROVIDER_SITE_OTHER)
Admission: RE | Admit: 2012-08-12 | Discharge: 2012-08-12 | Disposition: A | Discharge status: Home / Self Care | Payer: 59 | Source: Ambulatory Visit | Attending: Gastroenterology | Admitting: Gastroenterology

## 2012-08-12 ENCOUNTER — Ambulatory Visit (INDEPENDENT_AMBULATORY_CARE_PROVIDER_SITE_OTHER): Payer: 59 | Admitting: Gastroenterology

## 2012-08-12 ENCOUNTER — Encounter (HOSPITAL_COMMUNITY): Payer: Self-pay | Admitting: Registered Nurse

## 2012-08-12 VITALS — BP 160/80 | HR 64 | Ht 59.0 in | Wt 246.0 lb

## 2012-08-12 DIAGNOSIS — E119 Type 2 diabetes mellitus without complications: Secondary | ICD-10-CM

## 2012-08-12 DIAGNOSIS — R1031 Right lower quadrant pain: Secondary | ICD-10-CM

## 2012-08-12 DIAGNOSIS — R609 Edema, unspecified: Secondary | ICD-10-CM | POA: Diagnosis present

## 2012-08-12 DIAGNOSIS — I25119 Atherosclerotic heart disease of native coronary artery with unspecified angina pectoris: Secondary | ICD-10-CM | POA: Diagnosis present

## 2012-08-12 DIAGNOSIS — I2510 Atherosclerotic heart disease of native coronary artery without angina pectoris: Secondary | ICD-10-CM

## 2012-08-12 DIAGNOSIS — Z7982 Long term (current) use of aspirin: Secondary | ICD-10-CM

## 2012-08-12 DIAGNOSIS — N201 Calculus of ureter: Principal | ICD-10-CM

## 2012-08-12 DIAGNOSIS — K5900 Constipation, unspecified: Secondary | ICD-10-CM

## 2012-08-12 DIAGNOSIS — N179 Acute kidney failure, unspecified: Secondary | ICD-10-CM

## 2012-08-12 DIAGNOSIS — R82998 Other abnormal findings in urine: Secondary | ICD-10-CM | POA: Diagnosis present

## 2012-08-12 DIAGNOSIS — I519 Heart disease, unspecified: Secondary | ICD-10-CM | POA: Diagnosis present

## 2012-08-12 DIAGNOSIS — I249 Acute ischemic heart disease, unspecified: Secondary | ICD-10-CM

## 2012-08-12 DIAGNOSIS — I214 Non-ST elevation (NSTEMI) myocardial infarction: Secondary | ICD-10-CM | POA: Diagnosis not present

## 2012-08-12 DIAGNOSIS — T82897A Other specified complication of cardiac prosthetic devices, implants and grafts, initial encounter: Secondary | ICD-10-CM | POA: Diagnosis present

## 2012-08-12 DIAGNOSIS — E785 Hyperlipidemia, unspecified: Secondary | ICD-10-CM | POA: Diagnosis present

## 2012-08-12 DIAGNOSIS — R9431 Abnormal electrocardiogram [ECG] [EKG]: Secondary | ICD-10-CM | POA: Diagnosis present

## 2012-08-12 DIAGNOSIS — I252 Old myocardial infarction: Secondary | ICD-10-CM

## 2012-08-12 DIAGNOSIS — F411 Generalized anxiety disorder: Secondary | ICD-10-CM | POA: Diagnosis present

## 2012-08-12 DIAGNOSIS — G4733 Obstructive sleep apnea (adult) (pediatric): Secondary | ICD-10-CM | POA: Diagnosis present

## 2012-08-12 DIAGNOSIS — E875 Hyperkalemia: Secondary | ICD-10-CM | POA: Diagnosis present

## 2012-08-12 DIAGNOSIS — Z79899 Other long term (current) drug therapy: Secondary | ICD-10-CM

## 2012-08-12 DIAGNOSIS — D62 Acute posthemorrhagic anemia: Secondary | ICD-10-CM

## 2012-08-12 DIAGNOSIS — N390 Urinary tract infection, site not specified: Secondary | ICD-10-CM

## 2012-08-12 DIAGNOSIS — R109 Unspecified abdominal pain: Secondary | ICD-10-CM

## 2012-08-12 DIAGNOSIS — Z6841 Body Mass Index (BMI) 40.0 and over, adult: Secondary | ICD-10-CM

## 2012-08-12 DIAGNOSIS — I10 Essential (primary) hypertension: Secondary | ICD-10-CM

## 2012-08-12 DIAGNOSIS — N139 Obstructive and reflux uropathy, unspecified: Secondary | ICD-10-CM | POA: Diagnosis present

## 2012-08-12 DIAGNOSIS — Z794 Long term (current) use of insulin: Secondary | ICD-10-CM

## 2012-08-12 DIAGNOSIS — R10A0 Flank pain, unspecified side: Secondary | ICD-10-CM

## 2012-08-12 DIAGNOSIS — D509 Iron deficiency anemia, unspecified: Secondary | ICD-10-CM

## 2012-08-12 DIAGNOSIS — Y838 Other surgical procedures as the cause of abnormal reaction of the patient, or of later complication, without mention of misadventure at the time of the procedure: Secondary | ICD-10-CM | POA: Diagnosis present

## 2012-08-12 DIAGNOSIS — N200 Calculus of kidney: Secondary | ICD-10-CM

## 2012-08-12 HISTORY — PX: CYSTOSCOPY W/ URETERAL STENT PLACEMENT: SHX1429

## 2012-08-12 LAB — CBC WITH DIFFERENTIAL/PLATELET
Lymphocytes Relative: 11 % — ABNORMAL LOW (ref 12–46)
Lymphs Abs: 1.2 10*3/uL (ref 0.7–4.0)
Neutro Abs: 8.4 10*3/uL — ABNORMAL HIGH (ref 1.7–7.7)
Neutrophils Relative %: 80 % — ABNORMAL HIGH (ref 43–77)
Platelets: 204 10*3/uL (ref 150–400)
RBC: 3.99 MIL/uL (ref 3.87–5.11)
WBC: 10.5 10*3/uL (ref 4.0–10.5)

## 2012-08-12 LAB — BASIC METABOLIC PANEL
CO2: 22 mEq/L (ref 19–32)
GFR calc non Af Amer: 18 mL/min — ABNORMAL LOW (ref >90)
Glucose, Bld: 149 mg/dL — ABNORMAL HIGH (ref 70–99)
Potassium: 5.4 mEq/L — ABNORMAL HIGH (ref 3.5–5.1)
Sodium: 132 mEq/L — ABNORMAL LOW (ref 135–145)

## 2012-08-12 LAB — URINE MICROSCOPIC-ADD ON

## 2012-08-12 LAB — URINALYSIS, ROUTINE W REFLEX MICROSCOPIC
Glucose, UA: 100 mg/dL — AB
Specific Gravity, Urine: 1.022 (ref 1.005–1.030)
Urobilinogen, UA: 1 mg/dL (ref 0.0–1.0)

## 2012-08-12 SURGERY — CYSTOSCOPY, WITH RETROGRADE PYELOGRAM AND URETERAL STENT INSERTION
Anesthesia: General | Site: Ureter | Laterality: Bilateral | Wound class: Clean Contaminated

## 2012-08-12 MED ORDER — POLYETHYLENE GLYCOL 3350 17 G PO PACK
17.0000 g | PACK | Freq: Two times a day (BID) | ORAL | Status: DC
  Filled 2012-08-12: qty 1

## 2012-08-12 MED ORDER — DEXTROSE 5 % IV SOLN
1.0000 g | INTRAVENOUS | Status: DC
  Administered 2012-08-12: 1 g via INTRAVENOUS
  Filled 2012-08-12: qty 10

## 2012-08-12 MED ORDER — 0.9 % SODIUM CHLORIDE (POUR BTL) OPTIME
TOPICAL | Status: DC | PRN
  Administered 2012-08-12: 1000 mL

## 2012-08-12 MED ORDER — SODIUM CHLORIDE 0.9 % IV SOLN: 1000.0000 mL | INTRAVENOUS | Status: DC

## 2012-08-12 MED ORDER — FENTANYL CITRATE 0.05 MG/ML IJ SOLN
INTRAMUSCULAR | Status: DC | PRN
  Administered 2012-08-12: 50 ug via INTRAVENOUS

## 2012-08-12 MED ORDER — IOHEXOL 300 MG/ML  SOLN
INTRAMUSCULAR | Status: DC | PRN
  Administered 2012-08-12: 16 mL

## 2012-08-12 MED ORDER — SODIUM CHLORIDE 0.9 % IV SOLN
Freq: Once | INTRAVENOUS | Status: AC
  Administered 2012-08-12: 17:00:00 via INTRAVENOUS

## 2012-08-12 MED ORDER — SODIUM CHLORIDE 0.9 % IV SOLN
1000.0000 mL | Freq: Once | INTRAVENOUS | Status: AC
  Administered 2012-08-12: 1000 mL via INTRAVENOUS

## 2012-08-12 MED ORDER — INSULIN GLARGINE 100 UNIT/ML ~~LOC~~ SOLN
11.0000 [IU] | Freq: Every day | SUBCUTANEOUS | Status: DC
  Filled 2012-08-12: qty 0.11

## 2012-08-12 MED ORDER — LIDOCAINE HCL (CARDIAC) 20 MG/ML IV SOLN
INTRAVENOUS | Status: DC | PRN
  Administered 2012-08-12: 100 mg via INTRAVENOUS

## 2012-08-12 MED ORDER — ACETAMINOPHEN 325 MG PO TABS: 650.0000 mg | ORAL_TABLET | Freq: Four times a day (QID) | ORAL | Status: DC | PRN

## 2012-08-12 MED ORDER — INSULIN ASPART 100 UNIT/ML ~~LOC~~ SOLN
0.0000 [IU] | SUBCUTANEOUS | Status: DC
  Administered 2012-08-13: 2 [IU] via SUBCUTANEOUS

## 2012-08-12 MED ORDER — KETOROLAC TROMETHAMINE 30 MG/ML IJ SOLN
30.0000 mg | Freq: Once | INTRAMUSCULAR | Status: AC
  Administered 2012-08-12: 30 mg via INTRAVENOUS
  Filled 2012-08-12: qty 1

## 2012-08-12 MED ORDER — DIPHENHYDRAMINE HCL 50 MG/ML IJ SOLN
25.0000 mg | Freq: Once | INTRAMUSCULAR | Status: AC
  Administered 2012-08-12: 25 mg via INTRAVENOUS
  Filled 2012-08-12: qty 1

## 2012-08-12 MED ORDER — DOCUSATE SODIUM 100 MG PO CAPS
100.0000 mg | ORAL_CAPSULE | Freq: Two times a day (BID) | ORAL | Status: DC
  Administered 2012-08-13 – 2012-08-19 (×12): 100 mg via ORAL
  Filled 2012-08-12 (×14): qty 1

## 2012-08-12 MED ORDER — IOHEXOL 300 MG/ML  SOLN
100.0000 mL | Freq: Once | INTRAMUSCULAR | Status: AC | PRN
  Administered 2012-08-12: 100 mL via INTRAVENOUS

## 2012-08-12 MED ORDER — STERILE WATER FOR IRRIGATION IR SOLN
Status: DC | PRN
  Administered 2012-08-12: 3000 mL

## 2012-08-12 MED ORDER — ONDANSETRON HCL 4 MG/2ML IJ SOLN
INTRAMUSCULAR | Status: DC | PRN
  Administered 2012-08-12: 4 mg via INTRAVENOUS

## 2012-08-12 MED ORDER — HYDROMORPHONE HCL PF 1 MG/ML IJ SOLN: 0.5000 mg | INTRAMUSCULAR | Status: DC | PRN

## 2012-08-12 MED ORDER — LACTATED RINGERS IV SOLN
INTRAVENOUS | Status: DC | PRN
  Administered 2012-08-12: 23:00:00 via INTRAVENOUS

## 2012-08-12 MED ORDER — POLYETHYLENE GLYCOL 3350 17 GM/SCOOP PO POWD: 17.0000 g | Freq: Two times a day (BID) | ORAL | Status: DC

## 2012-08-12 MED ORDER — DEXTROSE 5 % IV SOLN: 1.0000 g | INTRAVENOUS | Status: DC

## 2012-08-12 MED ORDER — PROPOFOL 10 MG/ML IV BOLUS
INTRAVENOUS | Status: DC | PRN
  Administered 2012-08-12: 200 mg via INTRAVENOUS
  Administered 2012-08-12: 50 mg via INTRAVENOUS

## 2012-08-12 MED ORDER — FENTANYL CITRATE 0.05 MG/ML IJ SOLN
INTRAMUSCULAR | Status: AC
  Filled 2012-08-12: qty 2

## 2012-08-12 MED ORDER — HEPARIN SODIUM (PORCINE) 5000 UNIT/ML IJ SOLN
5000.0000 [IU] | Freq: Three times a day (TID) | INTRAMUSCULAR | Status: DC
  Filled 2012-08-12: qty 1

## 2012-08-12 MED ORDER — FENTANYL CITRATE 0.05 MG/ML IJ SOLN
50.0000 ug | Freq: Once | INTRAMUSCULAR | Status: AC
  Administered 2012-08-12: 50 ug via INTRAVENOUS

## 2012-08-12 MED ORDER — ONDANSETRON HCL 4 MG/2ML IJ SOLN
4.0000 mg | Freq: Once | INTRAMUSCULAR | Status: AC
  Administered 2012-08-12: 4 mg via INTRAVENOUS
  Filled 2012-08-12: qty 2

## 2012-08-12 MED ORDER — SUCCINYLCHOLINE CHLORIDE 20 MG/ML IJ SOLN
INTRAMUSCULAR | Status: DC | PRN
  Administered 2012-08-12: 80 mg via INTRAVENOUS

## 2012-08-12 MED ORDER — SODIUM CHLORIDE 0.9 % IV SOLN
INTRAVENOUS | Status: DC
  Administered 2012-08-13 – 2012-08-16 (×6): via INTRAVENOUS

## 2012-08-12 MED ORDER — HYDROMORPHONE HCL PF 1 MG/ML IJ SOLN
1.0000 mg | Freq: Once | INTRAMUSCULAR | Status: AC
  Administered 2012-08-12: 1 mg via INTRAVENOUS
  Filled 2012-08-12: qty 1

## 2012-08-12 MED ORDER — METOCLOPRAMIDE HCL 5 MG/ML IJ SOLN
10.0000 mg | Freq: Once | INTRAMUSCULAR | Status: AC
  Administered 2012-08-12: 10 mg via INTRAVENOUS
  Filled 2012-08-12: qty 2

## 2012-08-12 MED ORDER — ASPIRIN 325 MG PO TABS: 325.0000 mg | ORAL_TABLET | Freq: Every day | ORAL | Status: DC

## 2012-08-12 MED ORDER — GLYCOPYRROLATE 0.2 MG/ML IJ SOLN
INTRAMUSCULAR | Status: DC | PRN
  Administered 2012-08-12: 0.2 mg via INTRAVENOUS

## 2012-08-12 MED ORDER — IOHEXOL 300 MG/ML  SOLN
INTRAMUSCULAR | Status: AC
  Filled 2012-08-12: qty 1

## 2012-08-12 MED ORDER — EPHEDRINE SULFATE 50 MG/ML IJ SOLN
INTRAMUSCULAR | Status: DC | PRN
  Administered 2012-08-12: 5 mg via INTRAVENOUS

## 2012-08-12 MED ORDER — CARVEDILOL 6.25 MG PO TABS
6.2500 mg | ORAL_TABLET | Freq: Every evening | ORAL | Status: DC
  Filled 2012-08-12: qty 1

## 2012-08-12 SURGICAL SUPPLY — 27 items
ADAPTER CATH URET PLST 4-6FR (CATHETERS) ×1 IMPLANT
ADPR CATH URET STRL DISP 4-6FR (CATHETERS)
BAG URINE DRAINAGE (UROLOGICAL SUPPLIES) ×1 IMPLANT
BAG URO CATCHER STRL LF (DRAPE) ×2 IMPLANT
BASKET ZERO TIP NITINOL 2.4FR (BASKET) IMPLANT
BSKT STON RTRVL ZERO TP 2.4FR (BASKET)
CATH FOLEY 2WAY SLVR  5CC 16FR (CATHETERS) ×1
CATH FOLEY 2WAY SLVR 5CC 16FR (CATHETERS) IMPLANT
CATH INTERMIT  6FR 70CM (CATHETERS) ×1 IMPLANT
CLOTH BEACON ORANGE TIMEOUT ST (SAFETY) ×2 IMPLANT
DRAPE CAMERA CLOSED 9X96 (DRAPES) ×2 IMPLANT
GLOVE BIOGEL M STRL SZ7.5 (GLOVE) ×2 IMPLANT
GLOVE SURG SS PI 8.5 STRL IVOR (GLOVE) ×1
GLOVE SURG SS PI 8.5 STRL STRW (GLOVE) IMPLANT
GOWN STRL NON-REIN LRG LVL3 (GOWN DISPOSABLE) ×2 IMPLANT
GOWN STRL REIN XL XLG (GOWN DISPOSABLE) ×3 IMPLANT
GUIDEWIRE ANG ZIPWIRE 038X150 (WIRE) IMPLANT
GUIDEWIRE STR DUAL SENSOR (WIRE) ×2 IMPLANT
HOLDER FOLEY CATH W/STRAP (MISCELLANEOUS) ×1 IMPLANT
HOVERMATT HALF SINGLE USE (PATIENT TRANSFER) ×1 IMPLANT
MANIFOLD NEPTUNE II (INSTRUMENTS) ×2 IMPLANT
PACK CYSTO (CUSTOM PROCEDURE TRAY) ×2 IMPLANT
STENT CONTOUR 6FRX24X.038 (STENTS) ×2 IMPLANT
SYRINGE 10CC LL (SYRINGE) ×1 IMPLANT
TUBE FEEDING 8FR 16IN STR KANG (MISCELLANEOUS) ×1 IMPLANT
TUBING CONNECTING 10 (TUBING) ×2 IMPLANT
WATER STERILE IRR 500ML POUR (IV SOLUTION) ×1 IMPLANT

## 2012-08-12 NOTE — Patient Instructions (Addendum)
You have been scheduled for a CT scan of the abdomen and pelvis at [facility] ([address]---this is in the same building as [facility]).   You are scheduled on 08/12/12 at 3:00pm. You should arrive 15 minutes prior to your appointment time for registration. Please follow the written instructions below on the day of your exam:  WARNING: IF YOU ARE ALLERGIC TO IODINE/X-RAY DYE, PLEASE NOTIFY RADIOLOGY IMMEDIATELY AT [phone]! YOU WILL BE GIVEN A 13 HOUR PREMEDICATION PREP.  1) Do not eat or drink anything after 11:00am (4 hours prior to your test) 2) You have been given 2 bottles of oral contrast to drink. The solution may taste               better if refrigerated, but do NOT add ice or any other liquid to this solution. Shake             well before drinking.    Drink 1 bottle of contrast @ 1:00pm (2 hours prior to your exam)  Drink 1 bottle of contrast @ 2:00pm (1 hour prior to your exam)  You may take any medications as prescribed with a small amount of water except for the following: Metformin, Glucophage, Glucovance, Avandamet, Riomet, Fortamet, Actoplus Met, Janumet, Glumetza or Metaglip. The above medications must be held the day of the exam AND 48 hours after the exam.  The purpose of you drinking the oral contrast is to aid in the visualization of your intestinal tract. The contrast solution may cause some diarrhea. Before your exam is started, you will be given a small amount of fluid to drink. Depending on your individual set of symptoms, you may also receive an intravenous injection of x-ray contrast/dye. Plan on being at [facility] for 30 minutes or long, depending on the type of exam you are having performed.  This test typically takes 30-45 minutes to complete.   Please continue your antibiotics and pain rx that you have at home.  Thank you for choosing me and  Gastroenterology.  Doug Sou, PA-C    If you have any questions  regarding your exam or if you need to reschedule, you may call the CT department at [phone] between the hours of 8:00 am and 5:00 pm, Monday-Friday.  ________________________________________________________________________

## 2012-08-12 NOTE — ED Provider Notes (Signed)
History     CSN: [id number]  Arrival date & time 08/12/12  1551   First MD Initiated Contact with Patient 08/12/12 1636      Chief Complaint  Patient presents with  . Flank Pain  . Nephrolithiasis    (Consider location/radiation/quality/duration/timing/severity/associated sxs/prior treatment) Patient is a 62 y.o. female presenting with flank pain. The history is provided by the patient.  Flank Pain  She had onset 5 days ago of pain in the left flank and left side of her abdomen. She went to the emergency 4 days ago and was diagnosed with constipation. Since then, the pain has persisted and has now moved to the right side. There is associated nausea without vomiting. She denies fever, chills, sweats. She is constipated but relates this to taking narcotic. She states that she's been taking Percocet which has not been giving her relief of pain. She denies dysuria. Her PCP sent her for CT scan and she was noted to have kidney stones. Nothing makes her pain better and nothing makes it worse.  Past Medical History  Diagnosis Date  . MI (myocardial infarction)     2 stents  . Diabetes mellitus without complication   . Hypertension   . Hyperlipidemia   . Allergic rhinitis   . Obesity   . OSA (obstructive sleep apnea)   . Anxiety     Past Surgical History  Procedure Laterality Date  . Carotid stent insertion    . Eye surgery  3 years ago    laser surgery    Family History  Problem Relation Age of Onset  . Diabetes Father   . Heart failure Father   . Breast cancer Mother   . Diabetes Sister   . Stomach cancer Maternal Grandfather   . Diabetes Paternal Grandmother   . Diabetes Paternal Grandfather     History  Substance Use Topics  . Smoking status: Never Smoker   . Smokeless tobacco: Never Used  . Alcohol Use: Yes    OB History   Grav Para Term Preterm Abortions TAB SAB Ect Mult Living                  Review of Systems  Genitourinary: Positive for flank pain.   All other systems reviewed and are negative.    Allergies  Darvon; Flagyl; Levaquin; Lipitor; Minocycline; Morphine and related; Prednisone; Sudafed; Valium; and Zoloft  Home Medications   Current Outpatient Rx  Name  Route  Sig  Dispense  Refill  . Amino Acids (L-CARNITINE PO)   Oral   Take 1 tablet by mouth daily.         Marland Kitchen amoxicillin-clavulanate (AUGMENTIN) 875-125 MG per tablet   Oral   Take 1 tablet by mouth every 12 (twelve) hours.   20 tablet   0   . carvedilol (COREG) 12.5 MG tablet   Oral   Take 6.25 mg by mouth every evening.         . Cholecalciferol (VITAMIN D-3 PO)   Oral   Take 1 tablet by mouth daily.         . Coenzyme Q10 (CO Q 10 PO)   Oral   Take 1 tablet by mouth daily.         . Cyanocobalamin (VITAMIN B 12 PO)   Oral   Take 1 tablet by mouth daily.         Marland Kitchen docusate sodium (COLACE) 100 MG capsule   Oral   Take 1 capsule (  100 mg total) by mouth every 12 (twelve) hours.   30 capsule   0   . insulin aspart (NOVOLOG) 100 UNIT/ML injection   Subcutaneous   Inject 14 Units into the skin 3 (three) times daily with meals.         . insulin glargine (LANTUS) 100 UNIT/ML injection   Subcutaneous   Inject 22 Units into the skin at bedtime.         Marland Kitchen LYSINE PO   Oral   Take 1 tablet by mouth daily.         . metFORMIN (GLUCOPHAGE) 1000 MG tablet   Oral   Take 1,000 mg by mouth 2 (two) times daily with a meal.         . naproxen (NAPROSYN) 500 MG tablet   Oral   Take 1 tablet (500 mg total) by mouth 2 (two) times daily with a meal.   30 tablet   0   . olmesartan (BENICAR) 40 MG tablet   Oral   Take 40 mg by mouth daily.         . ondansetron (ZOFRAN ODT) 4 MG disintegrating tablet   Oral   Take 1 tablet (4 mg total) by mouth every 8 (eight) hours as needed for nausea.   10 tablet   0   . OVER THE COUNTER MEDICATION   Oral   Take 1 tablet by mouth daily. Tumeric Supplement         .  oxyCODONE-acetaminophen (PERCOCET) 5-325 MG per tablet   Oral   Take 1 tablet by mouth every 4 (four) hours as needed for pain.   20 tablet   0   . polyethylene glycol powder (GLYCOLAX/MIRALAX) powder   Oral   Take 17 g by mouth 2 (two) times daily. Until daily soft stools  OTC   255 g   0   . pravastatin (PRAVACHOL) 40 MG tablet   Oral   Take 20 mg by mouth daily.           BP 173/79  Pulse 75  Temp(Src) 98.7 F (37.1 C) (Oral)  Resp 18  SpO2 98%  Physical Exam  Nursing note and vitals reviewed.  Obese 62 year old female, who appears uncomfortable, but is in no acute distress. Vital signs are significant for hypertension with blood pressure 173/79. Oxygen saturation is 98%, which is normal. Head is normocephalic and atraumatic. PERRLA, EOMI. Oropharynx is clear. Neck is nontender and supple without adenopathy or JVD. Back is nontender in the midline. Lungs are clear without rales, wheezes, or rhonchi. Chest is nontender. Heart has regular rate and rhythm without murmur. Abdomen is soft, flat, with moderate bilateral lower abdominal tenderness which is worse on the right. There is mild bilateral CVA tenderness. There are no masses or hepatosplenomegaly and peristalsis is hypoactive. Extremities have 1+ edema, full range of motion is present. Venous stasis changes are present. Skin is warm and dry without rash. Neurologic: Mental status is normal, cranial nerves are intact, there are no motor or sensory deficits.  ED Course  Procedures (including critical care time)  Results for orders placed during the hospital encounter of 08/12/12  CBC WITH DIFFERENTIAL      Result Value Range   WBC 10.5  4.0 - 10.5 K/uL   RBC 3.99  3.87 - 5.11 MIL/uL   Hemoglobin 11.8 (*) 12.0 - 15.0 g/dL   HCT 82.9 (*) 56.2 - 13.0 %   MCV 86.2  78.0 -  100.0 fL   MCH 29.6  26.0 - 34.0 pg   MCHC 34.3  30.0 - 36.0 g/dL   RDW 16.1  09.6 - 04.5 %   Platelets 204  150 - 400 K/uL   Neutrophils  Relative 80 (*) 43 - 77 %   Neutro Abs 8.4 (*) 1.7 - 7.7 K/uL   Lymphocytes Relative 11 (*) 12 - 46 %   Lymphs Abs 1.2  0.7 - 4.0 K/uL   Monocytes Relative 8  3 - 12 %   Monocytes Absolute 0.8  0.1 - 1.0 K/uL   Eosinophils Relative 1  0 - 5 %   Eosinophils Absolute 0.1  0.0 - 0.7 K/uL   Basophils Relative 0  0 - 1 %   Basophils Absolute 0.0  0.0 - 0.1 K/uL  BASIC METABOLIC PANEL      Result Value Range   Sodium 132 (*) 135 - 145 mEq/L   Potassium 5.4 (*) 3.5 - 5.1 mEq/L   Chloride 98  96 - 112 mEq/L   CO2 22  19 - 32 mEq/L   Glucose, Bld 149 (*) 70 - 99 mg/dL   BUN 38 (*) 6 - 23 mg/dL   Creatinine, Ser 4.09 (*) 0.50 - 1.10 mg/dL   Calcium 9.4  8.4 - 81.1 mg/dL   GFR calc non Af Amer 18 (*) >90 mL/min   GFR calc Af Amer 21 (*) >90 mL/min  URINALYSIS, ROUTINE W REFLEX MICROSCOPIC      Result Value Range   Color, Urine RED (*) YELLOW   APPearance TURBID (*) CLEAR   Specific Gravity, Urine 1.022  1.005 - 1.030   pH 5.0  5.0 - 8.0   Glucose, UA 100 (*) NEGATIVE mg/dL   Hgb urine dipstick LARGE (*) NEGATIVE   Bilirubin Urine MODERATE (*) NEGATIVE   Ketones, ur 15 (*) NEGATIVE mg/dL   Protein, ur 914 (*) NEGATIVE mg/dL   Urobilinogen, UA 1.0  0.0 - 1.0 mg/dL   Nitrite NEGATIVE  NEGATIVE   Leukocytes, UA LARGE (*) NEGATIVE  URINE MICROSCOPIC-ADD ON      Result Value Range   Squamous Epithelial / LPF MANY (*) RARE   WBC, UA 21-50  <3 WBC/hpf   RBC / HPF TOO NUMEROUS TO COUNT  <3 RBC/hpf   Bacteria, UA FEW (*) RARE   Urine-Other LESS THAN 10 mL OF URINE SUBMITTED     Ct Abdomen Pelvis W Contrast  08/12/2012  *RADIOLOGY REPORT*  Clinical Data: Right lower quadrant pain, nausea, vomiting.  CT ABDOMEN AND PELVIS WITH CONTRAST  Technique:  Multidetector CT imaging of the abdomen and pelvis was performed following the standard protocol during bolus administration of intravenous contrast.  Contrast: OMNIPAQUE IOHEXOL 300 MG/ML  SOLN  Comparison: None.  Findings: Heart is normal  size.  Lung bases are clear.  No effusions.  Suspect mild fatty infiltration of the liver.  No focal abnormality.  Gallbladder, stomach, spleen, pancreas, adrenals are unremarkable.  There is an 8 mm stone within the proximal left ureter.  Mild fullness of the left renal collecting system.  3 mm distal right ureteral stone near the right UVJ.  Slight fullness of the right renal collecting system.  Small nonobstructing stone in the lower pole of the right kidney.  Appendix is visualized and is normal. Bowel grossly unremarkable. No free fluid, free air, or adenopathy.  Uterus, adnexa are unremarkable.  Urinary bladder decompressed.  There is a moderate sized umbilical hernia containing fat.  No acute bony abnormality.  Aortic and iliac calcifications.  No aneurysm.  IMPRESSION: 8 mm proximal left ureteral stone.  3 mm distal right ureteral stone.  Mild fullness of the renal collecting systems bilaterally.  Right nephrolithiasis.  Normal appendix.  Umbilical hernia containing fat.   Original Report Authenticated By: Charlett Nose, M.D.       1. Acute renal failure   2. Ureteral calculi   3. Flank pain       MDM  Bilateral ureterolithiasis. Stone which is most symptomatic now is a distal right ureteral calculus. Large left proximal ureteral calculus is probably going to need either lithotripsy or instrumentation to remove it. This likely can be set up to be done as an outpatient. Call today is pain control. Old records are reviewed and she was seen 4 days ago. At that time she had abdominal pain which is felt to be due to constipation. She will have a metabolic panel and CBC rechecked today as well as urinalysis rechecked. As long as urine is noninfected, and pain is adequately controlled, she should be able to be sent home with followup with urology.   Metabolic panel has come back is significant for creatinine of 2.74 which is a significant increase from 3 days ago when it was 0.8. Acute renal failure  appears to be multifactorial. She has bilateral renal calculi without true obstruction as I do not believe that is the major cause of her renal failure. She is getting NSAIDs (naproxen and ketorolac) and is also on ARB's (olmesartan). Case is discussed with Dr. Berneice [facility] who requests the patient be transferred to [facility] hospital for ureteral stenting tonight. Case is discussed with Dr. Julian Reil of [facility] who is coming to do admitting history and physical. Case is discussed with Dr. Maye Hides physician at [facility] notify him of the patient being transferred to was a long ED where Dr. Berneice Heinrich will take her directly to the operating room.     Dione Booze, MD 08/12/12 [phone]

## 2012-08-12 NOTE — Anesthesia Preprocedure Evaluation (Addendum)
Anesthesia Evaluation  Patient identified by MRN, date of birth, ID band Patient awake    Reviewed: Allergy & Precautions, H&P , NPO status , Patient's Chart, lab work & pertinent test results  Airway Mallampati: III TM Distance: >3 FB Neck ROM: full    Dental no notable dental hx.    Pulmonary sleep apnea ,  breath sounds clear to auscultation  Pulmonary exam normal       Cardiovascular Exercise Tolerance: Good hypertension, Pt. on medications + Past MI and + Cardiac Stents Rhythm:regular Rate:Normal     Neuro/Psych Anxiety negative neurological ROS  negative psych ROS   GI/Hepatic negative GI ROS, Neg liver ROS,   Endo/Other  negative endocrine ROSdiabetes, Well Controlled, Type 2, Insulin DependentMorbid obesity  Renal/GU ARFRenal disease  negative genitourinary   Musculoskeletal   Abdominal Normal abdominal exam  (+) + obese,   Peds  Hematology negative hematology ROS (+)   Anesthesia Other Findings   Reproductive/Obstetrics negative OB ROS                          Anesthesia Physical Anesthesia Plan  ASA: III and emergent  Anesthesia Plan: General   Post-op Pain Management:    Induction: Intravenous  Airway Management Planned: Oral ETT  Additional Equipment:   Intra-op Plan:   Post-operative Plan:   Informed Consent: I have reviewed the patients History and Physical, chart, labs and discussed the procedure including the risks, benefits and alternatives for the proposed anesthesia with the patient or authorized representative who has indicated his/her understanding and acceptance.   Dental Advisory Given  Plan Discussed with: CRNA and Surgeon  Anesthesia Plan Comments:        Anesthesia Quick Evaluation

## 2012-08-12 NOTE — ED Notes (Signed)
IV in place upon arrival to ED, flushed without difficulty

## 2012-08-12 NOTE — Preoperative (Signed)
Beta Blockers   Reason not to administer Beta Blockers:Coreg taken 08-11-12, Beta blocker not given due to HR between 42-53. Also treated BP intraop .

## 2012-08-12 NOTE — ED Notes (Addendum)
[facility] LEFT FOR TRANSPORT. HUSBAND TOOK PT BELONGINGS.

## 2012-08-12 NOTE — H&P (Addendum)
[facility] History and Physical  Flordia Kassem AOZ:[id number] DOB: 1950-08-16 DOA: 08/12/2012  Referring physician: ED PCP: Ezequiel Kayser, MD  Specialists: None  Chief Complaint: Flank pain  HPI: Kimberly Hardin is a 62 y.o. female who presents with c/o flank pain.  Flank pain initially started on her L side on Friday but really became severe today on her R side.  Pain is 10/10, severe and radiates down her flank.  When patient saw doctor on Friday she was started on pain meds including naproxen.  Patient had CT scan as out patient which demonstrated B kidney stones (3mm L sided stone, and 8mm R sided stone and B "fullness" of the collecting system).  Because of her symptoms she was referred to the ED.  In the ED patient was treated with ketorlac before her labs came back, when her labs came back she was found to have pyuria, and AKF with creatinine of 2.74 (was 0.7 4 days ago).  Hospitalist has been asked to admit the patient.  Review of Systems: Positive for dysuria, negative for fever, chills, 12 systems reviewed and otherwise negative.  Past Medical History  Diagnosis Date  . MI (myocardial infarction)     2 stents  . Diabetes mellitus without complication   . Hypertension   . Hyperlipidemia   . Allergic rhinitis   . Obesity   . OSA (obstructive sleep apnea)   . Anxiety    Past Surgical History  Procedure Laterality Date  . Carotid stent insertion    . Eye surgery  3 years ago    laser surgery   Social History:  reports that she has never smoked. She has never used smokeless tobacco. She reports that  drinks alcohol. She reports that she does not use illicit drugs.   Allergies  Allergen Reactions  . Darvon (Propoxyphene Hcl) Nausea And Vomiting and Other (See Comments)    Headaches   . Flagyl (Metronidazole) Other (See Comments)    Unknown   . Levaquin (Levofloxacin In D5w) Other (See Comments)    hallucinations  . Minocycline Other (See Comments)    Severe  nausea and vomiting, severe diarrhea  . Morphine And Related Other (See Comments)    Seeing hallucinations, out of body experience-floating above her body feeling  . Prednisone Other (See Comments)    Vision disturbances, completely unfocused sight and caused some eyesight damage, after stopped taking med   . Statins Other (See Comments)    Caused muscle cramps  . Sudafed (Pseudoephedrine Hcl) Other (See Comments)    Causes High blood pressure  . Valium (Diazepam) Other (See Comments)    Hallucinations, caused tunnel vision.  . Zoloft (Sertraline Hcl) Other (See Comments)    nightmares    Family History  Problem Relation Age of Onset  . Diabetes Father   . Heart failure Father   . Breast cancer Mother   . Diabetes Sister   . Stomach cancer Maternal Grandfather   . Diabetes Paternal Grandmother   . Diabetes Paternal Grandfather     Prior to Admission medications   Medication Sig Start Date End Date Taking? Authorizing Provider  acetaminophen (TYLENOL) 325 MG tablet Take 650 mg by mouth every 6 (six) hours as needed for pain.   Yes Historical Provider, MD  Amino Acids (L-CARNITINE PO) Take 1 tablet by mouth daily.   Yes Historical Provider, MD  amoxicillin-clavulanate (AUGMENTIN) 875-125 MG per tablet Take 1 tablet by mouth every 12 (twelve) hours. 08/08/12  Yes Oris Drone  Hyacinth Meeker, MD  aspirin 325 MG tablet Take 325-650 mg by mouth daily.   Yes Historical Provider, MD  carvedilol (COREG) 12.5 MG tablet Take 6.25 mg by mouth every evening.   Yes Historical Provider, MD  Cholecalciferol (VITAMIN D-3 PO) Take 200 mg by mouth daily.    Yes Historical Provider, MD  Coenzyme Q10 (COQ10) 200 MG CAPS Take 200 mg by mouth daily.   Yes Historical Provider, MD  Cyanocobalamin (VITAMIN B 12 PO) Take 2 tablets by mouth daily.    Yes Historical Provider, MD  docusate sodium (COLACE) 100 MG capsule Take 1 capsule (100 mg total) by mouth every 12 (twelve) hours. 08/08/12  Yes Vida Roller, MD   insulin aspart (NOVOLOG) 100 UNIT/ML injection Inject 14 Units into the skin 3 (three) times daily with meals.   Yes Historical Provider, MD  insulin glargine (LANTUS) 100 UNIT/ML injection Inject 22 Units into the skin at bedtime.   Yes Historical Provider, MD  Levocarnitine 250 MG CAPS Take 1 capsule by mouth daily.   Yes Historical Provider, MD  Lysine 500 MG TABS Take 2 tablets by mouth daily.   Yes Historical Provider, MD  metFORMIN (GLUCOPHAGE) 1000 MG tablet Take 1,000 mg by mouth 2 (two) times daily with a meal.   Yes Historical Provider, MD  Multiple Vitamin (MULTIVITAMIN WITH MINERALS) TABS Take 1 tablet by mouth daily.   Yes Historical Provider, MD  naproxen (NAPROSYN) 500 MG tablet Take 1 tablet (500 mg total) by mouth 2 (two) times daily with a meal. 08/08/12  Yes Vida Roller, MD  olmesartan (BENICAR) 40 MG tablet Take 20 mg by mouth daily.    Yes Historical Provider, MD  OVER THE COUNTER MEDICATION Take 1 tablet by mouth 2 (two) times daily. Tumeric Supplement   Yes Historical Provider, MD  oxyCODONE-acetaminophen (PERCOCET/ROXICET) 5-325 MG per tablet Take 0.5-1 tablets by mouth every 4 (four) hours as needed for pain. 08/08/12  Yes Vida Roller, MD  polyethylene glycol powder (GLYCOLAX/MIRALAX) powder Take 17 g by mouth 2 (two) times daily. Until daily soft stools  OTC 08/08/12  Yes Vida Roller, MD   Physical Exam: Filed Vitals:   08/12/12 1559 08/12/12 1726 08/12/12 2000  BP: 173/79  131/53  Pulse: 75 69 64  Temp: 98.7 F (37.1 C)    TempSrc: Oral    Resp: 18    SpO2: 98% 93% 99%    General:  NAD, resting comfortably in bed Eyes: PEERLA EOMI ENT: mucous membranes moist Neck: supple w/o JVD Cardiovascular: RRR w/o MRG Respiratory: CTA B Abdomen: soft, nt, obese, bs+, no CVA tenderness Skin: no rash nor lesion Musculoskeletal: MAE, full ROM all 4 extremities Psychiatric: normal tone and affect Neurologic: AAOx3, grossly non-focal  Labs on Admission:  Basic  Metabolic Panel:  Recent Labs Lab 08/08/12 1406 08/08/12 1622 08/12/12 1705  NA 133*  --  132*  K 6.2* 4.9 5.4*  CL 99  --  98  CO2 23  --  22  GLUCOSE 284*  --  149*  BUN 20  --  38*  CREATININE 0.83  --  2.74*  CALCIUM 10.3  --  9.4   Liver Function Tests:  Recent Labs Lab 08/08/12 1406  AST 60*  ALT 35  ALKPHOS 69  BILITOT 0.3  PROT 8.0  ALBUMIN 3.8    Recent Labs Lab 08/08/12 1406  LIPASE 33   No results found for this basename: AMMONIA,  in the last 168 hours  CBC:  Recent Labs Lab 08/08/12 1406 08/12/12 1705  WBC 9.3 10.5  NEUTROABS 6.5 8.4*  HGB 13.1 11.8*  HCT 38.3 34.4*  MCV 87.6 86.2  PLT 242 204   Cardiac Enzymes: No results found for this basename: CKTOTAL, CKMB, CKMBINDEX, TROPONINI,  in the last 168 hours  BNP (last 3 results) No results found for this basename: PROBNP,  in the last 8760 hours CBG: No results found for this basename: GLUCAP,  in the last 168 hours  Radiological Exams on Admission: Ct Abdomen Pelvis W Contrast  08/12/2012  *RADIOLOGY REPORT*  Clinical Data: Right lower quadrant pain, nausea, vomiting.  CT ABDOMEN AND PELVIS WITH CONTRAST  Technique:  Multidetector CT imaging of the abdomen and pelvis was performed following the standard protocol during bolus administration of intravenous contrast.  Contrast: OMNIPAQUE IOHEXOL 300 MG/ML  SOLN  Comparison: None.  Findings: Heart is normal size.  Lung bases are clear.  No effusions.  Suspect mild fatty infiltration of the liver.  No focal abnormality.  Gallbladder, stomach, spleen, pancreas, adrenals are unremarkable.  There is an 8 mm stone within the proximal left ureter.  Mild fullness of the left renal collecting system.  3 mm distal right ureteral stone near the right UVJ.  Slight fullness of the right renal collecting system.  Small nonobstructing stone in the lower pole of the right kidney.  Appendix is visualized and is normal. Bowel grossly unremarkable. No free fluid,  free air, or adenopathy.  Uterus, adnexa are unremarkable.  Urinary bladder decompressed.  There is a moderate sized umbilical hernia containing fat.  No acute bony abnormality.  Aortic and iliac calcifications.  No aneurysm.  IMPRESSION: 8 mm proximal left ureteral stone.  3 mm distal right ureteral stone.  Mild fullness of the renal collecting systems bilaterally.  Right nephrolithiasis.  Normal appendix.  Umbilical hernia containing fat.   Original Report Authenticated By: Charlett Nose, M.D.     EKG: Independently reviewed.  Assessment/Plan Principal Problem:   Bilateral kidney stones Active Problems:   Acute kidney failure   UTI (lower urinary tract infection)   DM2 (diabetes mellitus, type 2)   HTN (hypertension)   1. B kidney stones - causing some degree of B obstruction and likely AKF.  Patient currently scheduled to get B ureteral stents by Urology over at [facility] this evening. 2. AKF - multifactorial, large portion or possibly all secondary to #1, but some of this could be nephrotoxicity of NSAIDS as well, holding ARB and NSAIDS, and treating urinary obstruction via ureteral stenting. 3. UTI - while no nitrites in the urine and only few bacteria, patient does have pyuria and symptoms of dysuria, so treating her with rocephin empirically while cultures are pending.  No evidence of systemic involvement at this point, and as mentioned above current plan is for stent placement this evening by urology. 4. DM2 - holding home metformin, putting patient on half home dose of lantus and using q4h med dose SSI while NPO for surgery. 5. HTN - continue home meds other than ARB. 6. Hyperkalemia - potassium of 5.4, will keep on tele monitor, recheck BMP in AM, likely if as i suspect a large portion of this patients AKF is obstructive this should resolve quickly with resolution of urinary obstruction.  Urology has been consulted and are expecting the patient at [facility] with plans to perform B ureteral  stenting.  Code Status: Full Code (must indicate code status--if unknown or must be presumed, indicate so)  Family Communication: Spoke with husband at bedside (indicate person spoken with, if applicable, with phone number if by telephone) Disposition Plan: Admit to inpatient (indicate anticipated LOS)  Time spent: 70 min  GARDNER, JARED M. [facility] Pager [phone]  If 7PM-7AM, please contact night-coverage [email or web address] Password [facility] 08/12/2012, 9:07 PM

## 2012-08-12 NOTE — Brief Op Note (Signed)
08/12/2012  11:54 PM  PATIENT:  Kimberly Hardin  62 y.o. female  PRE-OPERATIVE DIAGNOSIS:  hydronephrosis  POST-OPERATIVE DIAGNOSIS:  renal failure/bilateral ureteral stones  PROCEDURE:  Procedure(s): CYSTOSCOPY WITH RETROGRADE PYELOGRAM/URETERAL STENT PLACEMENT (Bilateral)  SURGEON:  Surgeon(s) and Role:    * Sebastian Ache, MD - Primary  PHYSICIAN ASSISTANT:   ASSISTANTS: none   ANESTHESIA:   general  EBL:     BLOOD ADMINISTERED:none  DRAINS: 23F foley to straight drain   LOCAL MEDICATIONS USED:  NONE  SPECIMEN:  No Specimen  DISPOSITION OF SPECIMEN:  N/A  COUNTS:  YES  TOURNIQUET:  * No tourniquets in log *  DICTATION: .Other Dictation: Dictation Number [id number]  PLAN OF CARE: Admit to inpatient   PATIENT DISPOSITION:  PACU - hemodynamically stable.   Delay start of Pharmacological VTE agent (>24hrs) due to surgical blood loss or risk of bleeding: not applicable

## 2012-08-12 NOTE — ED Notes (Signed)
Pt c/o right sided flank and abd pain; pt had outpatient CT and told has kidney stone; pt seen here on Friday and sent home

## 2012-08-12 NOTE — ED Notes (Signed)
Pt transported to OR

## 2012-08-12 NOTE — Consult Note (Signed)
Reason for Consult: Bilateral Ureteral Stones, Acute Renal Failure  Referring Physician: Julian Reil, DO  Kimberly Hardin is an 62 y.o. female.   HPI:   1 - Bilateral Ureteral Stones - Pt with Rt distal 3mm UVJ stone and Left proximal 8mm stones with bilateral hydro found on CT at [facility] ER on work-up of perisistant abdominal pain x 6 days. No prior stone episodes.   2 - Acute Renal Failure  - Baseline Cr <1, now up to 2.74 and with hyperkalemia to 5.4. Pt with acute 8lb weight gain and decreased UOP past few days.  No fevers / chills. Occasional nausea with emesis.   Past Medical History  Diagnosis Date  . MI (myocardial infarction)     2 stents  . Diabetes mellitus without complication   . Hypertension   . Hyperlipidemia   . Allergic rhinitis   . Obesity   . OSA (obstructive sleep apnea)   . Anxiety     Past Surgical History  Procedure Laterality Date  . Carotid stent insertion    . Eye surgery  3 years ago    laser surgery    Family History  Problem Relation Age of Onset  . Diabetes Father   . Heart failure Father   . Breast cancer Mother   . Diabetes Sister   . Stomach cancer Maternal Grandfather   . Diabetes Paternal Grandmother   . Diabetes Paternal Grandfather     Social History:  reports that she has never smoked. She has never used smokeless tobacco. She reports that  drinks alcohol. She reports that she does not use illicit drugs.  Allergies:  Allergies  Allergen Reactions  . Darvon (Propoxyphene Hcl) Nausea And Vomiting and Other (See Comments)    Headaches   . Flagyl (Metronidazole) Other (See Comments)    Unknown   . Levaquin (Levofloxacin In D5w) Other (See Comments)    hallucinations  . Minocycline Other (See Comments)    Severe nausea and vomiting, severe diarrhea  . Morphine And Related Other (See Comments)    Seeing hallucinations, out of body experience-floating above her body feeling  . Prednisone Other (See Comments)    Vision disturbances,  completely unfocused sight and caused some eyesight damage, after stopped taking med   . Statins Other (See Comments)    Caused muscle cramps  . Sudafed (Pseudoephedrine Hcl) Other (See Comments)    Causes High blood pressure  . Valium (Diazepam) Other (See Comments)    Hallucinations, caused tunnel vision.  . Zoloft (Sertraline Hcl) Other (See Comments)    nightmares    Medications: I have reviewed the patient's current medications.  Results for orders placed during the hospital encounter of 08/12/12 (from the past 48 hour(s))  CBC WITH DIFFERENTIAL     Status: Abnormal   Collection Time    08/12/12  5:05 PM      Result Value Range   WBC 10.5  4.0 - 10.5 K/uL   RBC 3.99  3.87 - 5.11 MIL/uL   Hemoglobin 11.8 (*) 12.0 - 15.0 g/dL   HCT 16.1 (*) 09.6 - 04.5 %   MCV 86.2  78.0 - 100.0 fL   MCH 29.6  26.0 - 34.0 pg   MCHC 34.3  30.0 - 36.0 g/dL   RDW 40.9  81.1 - 91.4 %   Platelets 204  150 - 400 K/uL   Neutrophils Relative 80 (*) 43 - 77 %   Neutro Abs 8.4 (*) 1.7 - 7.7 K/uL  Lymphocytes Relative 11 (*) 12 - 46 %   Lymphs Abs 1.2  0.7 - 4.0 K/uL   Monocytes Relative 8  3 - 12 %   Monocytes Absolute 0.8  0.1 - 1.0 K/uL   Eosinophils Relative 1  0 - 5 %   Eosinophils Absolute 0.1  0.0 - 0.7 K/uL   Basophils Relative 0  0 - 1 %   Basophils Absolute 0.0  0.0 - 0.1 K/uL  BASIC METABOLIC PANEL     Status: Abnormal   Collection Time    08/12/12  5:05 PM      Result Value Range   Sodium 132 (*) 135 - 145 mEq/L   Potassium 5.4 (*) 3.5 - 5.1 mEq/L   Chloride 98  96 - 112 mEq/L   CO2 22  19 - 32 mEq/L   Glucose, Bld 149 (*) 70 - 99 mg/dL   BUN 38 (*) 6 - 23 mg/dL   Creatinine, Ser 1.61 (*) 0.50 - 1.10 mg/dL   Calcium 9.4  8.4 - 09.6 mg/dL   GFR calc non Af Amer 18 (*) >90 mL/min   GFR calc Af Amer 21 (*) >90 mL/min   Comment:            The eGFR has been calculated     using the CKD EPI equation.     This calculation has not been     validated in all clinical      situations.     eGFR's persistently     <90 mL/min signify     possible Chronic Kidney Disease.  URINALYSIS, ROUTINE W REFLEX MICROSCOPIC     Status: Abnormal   Collection Time    08/12/12  5:30 PM      Result Value Range   Color, Urine RED (*) YELLOW   Comment: BIOCHEMICALS MAY BE AFFECTED BY COLOR   APPearance TURBID (*) CLEAR   Specific Gravity, Urine 1.022  1.005 - 1.030   pH 5.0  5.0 - 8.0   Glucose, UA 100 (*) NEGATIVE mg/dL   Hgb urine dipstick LARGE (*) NEGATIVE   Bilirubin Urine MODERATE (*) NEGATIVE   Ketones, ur 15 (*) NEGATIVE mg/dL   Protein, ur 045 (*) NEGATIVE mg/dL   Urobilinogen, UA 1.0  0.0 - 1.0 mg/dL   Nitrite NEGATIVE  NEGATIVE   Leukocytes, UA LARGE (*) NEGATIVE  URINE MICROSCOPIC-ADD ON     Status: Abnormal   Collection Time    08/12/12  5:30 PM      Result Value Range   Squamous Epithelial / LPF MANY (*) RARE   WBC, UA 21-50  <3 WBC/hpf   RBC / HPF TOO NUMEROUS TO COUNT  <3 RBC/hpf   Bacteria, UA FEW (*) RARE   Urine-Other LESS THAN 10 mL OF URINE SUBMITTED     Comment: MICROSCOPIC EXAM PERFORMED ON UNCONCENTRATED URINE    Ct Abdomen Pelvis W Contrast  08/12/2012  *RADIOLOGY REPORT*  Clinical Data: Right lower quadrant pain, nausea, vomiting.  CT ABDOMEN AND PELVIS WITH CONTRAST  Technique:  Multidetector CT imaging of the abdomen and pelvis was performed following the standard protocol during bolus administration of intravenous contrast.  Contrast: OMNIPAQUE IOHEXOL 300 MG/ML  SOLN  Comparison: None.  Findings: Heart is normal size.  Lung bases are clear.  No effusions.  Suspect mild fatty infiltration of the liver.  No focal abnormality.  Gallbladder, stomach, spleen, pancreas, adrenals are unremarkable.  There is an 8 mm stone within the  proximal left ureter.  Mild fullness of the left renal collecting system.  3 mm distal right ureteral stone near the right UVJ.  Slight fullness of the right renal collecting system.  Small nonobstructing stone in  the lower pole of the right kidney.  Appendix is visualized and is normal. Bowel grossly unremarkable. No free fluid, free air, or adenopathy.  Uterus, adnexa are unremarkable.  Urinary bladder decompressed.  There is a moderate sized umbilical hernia containing fat.  No acute bony abnormality.  Aortic and iliac calcifications.  No aneurysm.  IMPRESSION: 8 mm proximal left ureteral stone.  3 mm distal right ureteral stone.  Mild fullness of the renal collecting systems bilaterally.  Right nephrolithiasis.  Normal appendix.  Umbilical hernia containing fat.   Original Report Authenticated By: Charlett Nose, M.D.     Review of Systems  Constitutional: Positive for malaise/fatigue.  HENT: Negative.   Eyes: Negative.   Cardiovascular: Negative.   Gastrointestinal: Negative.   Genitourinary: Positive for flank pain.  Musculoskeletal: Negative.   Skin: Negative.   Neurological: Negative.   Endo/Heme/Allergies: Negative.   Psychiatric/Behavioral: Negative.    Blood pressure 142/59, pulse 63, temperature 98.7 F (37.1 C), temperature source Oral, resp. rate 18, SpO2 99.00%. Physical Exam  Constitutional: She is oriented to person, place, and time. She appears well-developed and well-nourished.  IN ER, family at bedside, Pt morbidly obese on stretcher  HENT:  Head: Normocephalic and atraumatic.  Eyes: EOM are normal. Pupils are equal, round, and reactive to light.  Neck: Normal range of motion. Neck supple.  Cardiovascular: Normal rate and regular rhythm.   Respiratory: Effort normal and breath sounds normal.  GI: Soft. Bowel sounds are normal.  Obesity limits sensitivity of exam  Genitourinary:  Rt > Lt CVAT  Musculoskeletal: Normal range of motion.  Neurological: She is alert and oriented to person, place, and time.  Skin: Skin is warm and dry.  Psychiatric: She has a normal mood and affect. Her behavior is normal. Judgment and thought content normal.    Assessment/Plan:  1 - Bilateral  Ureteral Stones / Acute Renal Failure - Discussed need for urgent renal drainage with nephrostomy v. Stents. She has opted for trial of stents with goal of resolving renal obstruction and temporizing until definitive stone management, likely with ureteroscopy, in a few weeks after GFR returns to normal.  Risks including bleeding, infection, inability to place / need for nephrostomy discussed as well as rare risks including loss of kidney / ureteral injury / DVT / PE, CVA, Mortality.  Hospitalist admit post-op, we will follow.  Kimberly Hardin 08/12/2012, 10:27 PM

## 2012-08-12 NOTE — Progress Notes (Signed)
08/12/2012 Kimberly Hardin [id number] [birth year]   HISTORY OF PRESENT ILLNESS:  Patient is a 62 year old female who presents to our office today with complaints of abdominal pain.  She says that las week she was constipated so she took some magnesium tabs.  Last Wednesday she had 5-7 BM's and she thought that everything would return to normal, however, Wednesday night she developed abdominal pain that has just been worsening since that time.  She went to the ED on Friday where CBC and lipase were normal.  CT scan was not performed (no imaging was performed) but she was told that she could have diverticulitis.  She was given a prescription for augmentin since she is allergic to levaquin and flagyl.  She was also given oxycodone for pain.  Says that pain has continued to worsen.  Never had any similar pain in the past.  Has not moved her bowels again at all since last Wednesday.  Nausea and a couple episodes of non-bloody emesis.  No pain with urination.  No fevers.  Has a lot of belching.   Past Medical History  Diagnosis Date  . MI (myocardial infarction)     2 stents  . Diabetes mellitus without complication   . Hypertension   . Hyperlipidemia   . Allergic rhinitis   . Obesity   . OSA (obstructive sleep apnea)   . Anxiety    Past Surgical History  Procedure Laterality Date  . Carotid stent insertion    . Eye surgery  3 years ago    laser surgery    reports that she has never smoked. She has never used smokeless tobacco. She reports that  drinks alcohol. She reports that she does not use illicit drugs. family history includes Breast cancer in her mother; Diabetes in her father, paternal grandfather, paternal grandmother, and sister; Heart failure in her father; and Stomach cancer in her maternal grandfather. Allergies  Allergen Reactions  . Darvon (Propoxyphene Hcl) Nausea And Vomiting  . Flagyl (Metronidazole)   . Levaquin (Levofloxacin In D5w)   . Lipitor (Atorvastatin)   .  Minocycline Nausea And Vomiting  . Morphine And Related   . Prednisone Other (See Comments)    Vision disturbances   . Sudafed (Pseudoephedrine Hcl)   . Valium (Diazepam) Other (See Comments)    hallucinations  . Zoloft (Sertraline Hcl)       Outpatient Encounter Prescriptions as of 08/12/2012  Medication Sig Dispense Refill  . Amino Acids (L-CARNITINE PO) Take 1 tablet by mouth daily.      Marland Kitchen amoxicillin-clavulanate (AUGMENTIN) 875-125 MG per tablet Take 1 tablet by mouth every 12 (twelve) hours.  20 tablet  0  . carvedilol (COREG) 12.5 MG tablet Take 6.25 mg by mouth every evening.      . Cholecalciferol (VITAMIN D-3 PO) Take 1 tablet by mouth daily.      . Coenzyme Q10 (CO Q 10 PO) Take 1 tablet by mouth daily.      . Cyanocobalamin (VITAMIN B 12 PO) Take 1 tablet by mouth daily.      Marland Kitchen docusate sodium (COLACE) 100 MG capsule Take 1 capsule (100 mg total) by mouth every 12 (twelve) hours.  30 capsule  0  . insulin aspart (NOVOLOG) 100 UNIT/ML injection Inject 14 Units into the skin 3 (three) times daily with meals.      . insulin glargine (LANTUS) 100 UNIT/ML injection Inject 22 Units into the skin at bedtime.      Marland Kitchen LYSINE  PO Take 1 tablet by mouth daily.      . metFORMIN (GLUCOPHAGE) 1000 MG tablet Take 1,000 mg by mouth 2 (two) times daily with a meal.      . naproxen (NAPROSYN) 500 MG tablet Take 1 tablet (500 mg total) by mouth 2 (two) times daily with a meal.  30 tablet  0  . olmesartan (BENICAR) 40 MG tablet Take 40 mg by mouth daily.      . ondansetron (ZOFRAN ODT) 4 MG disintegrating tablet Take 1 tablet (4 mg total) by mouth every 8 (eight) hours as needed for nausea.  10 tablet  0  . OVER THE COUNTER MEDICATION Take 1 tablet by mouth daily. Tumeric Supplement      . oxyCODONE-acetaminophen (PERCOCET) 5-325 MG per tablet Take 1 tablet by mouth every 4 (four) hours as needed for pain.  20 tablet  0  . polyethylene glycol powder (GLYCOLAX/MIRALAX) powder Take 17 g by mouth 2  (two) times daily. Until daily soft stools  OTC  255 g  0  . pravastatin (PRAVACHOL) 40 MG tablet Take 20 mg by mouth daily.       No facility-administered encounter medications on file as of 08/12/2012.     REVIEW OF SYSTEMS  : All other systems reviewed and negative except where noted in the History of Present Illness.   PHYSICAL EXAM: BP 160/80  Pulse 64  Ht 4\' 11"  (1.499 m)  Wt 246 lb (111.585 kg)  BMI 49.66 kg/m2 General: Well developed white female in no acute distress Head: Normocephalic and atraumatic Eyes:  sclerae anicteric,conjunctive pink. Ears: Normal auditory acuity Lungs: Clear throughout to auscultation Heart: Regular rate and rhythm Abdomen: Soft, obese.  BS present.  RLQ TTP without R/R/G. Musculoskeletal: Symmetrical with no gross deformities  Skin: No lesions on visible extremities Extremities: Some edema in B/L LE's Neurological: Alert oriented x 4, grossly nonfocal Psychological:  Alert and cooperative. Normal mood and affect  ASSESSMENT AND PLAN: -Abdominal pain, RLQ:  Rule out appendicitis vs bowel obstruction and other etiologies.  STAT CT scan of the abdomen and pelvis with oral and IV contrast.  Continue antibiotics for now.

## 2012-08-12 NOTE — ED Notes (Signed)
Pt moved to triage WR to be observed after pain meds

## 2012-08-13 ENCOUNTER — Encounter (HOSPITAL_COMMUNITY): Payer: Self-pay | Admitting: *Deleted

## 2012-08-13 DIAGNOSIS — K5900 Constipation, unspecified: Secondary | ICD-10-CM

## 2012-08-13 DIAGNOSIS — I200 Unstable angina: Secondary | ICD-10-CM

## 2012-08-13 DIAGNOSIS — R609 Edema, unspecified: Secondary | ICD-10-CM

## 2012-08-13 DIAGNOSIS — M79609 Pain in unspecified limb: Secondary | ICD-10-CM

## 2012-08-13 LAB — GLUCOSE, CAPILLARY
Glucose-Capillary: 112 mg/dL — ABNORMAL HIGH (ref 70–99)
Glucose-Capillary: 114 mg/dL — ABNORMAL HIGH (ref 70–99)
Glucose-Capillary: 121 mg/dL — ABNORMAL HIGH (ref 70–99)

## 2012-08-13 LAB — BASIC METABOLIC PANEL
BUN: 41 mg/dL — ABNORMAL HIGH (ref 6–23)
CO2: 23 mEq/L (ref 19–32)
Chloride: 104 mEq/L (ref 96–112)
Creatinine, Ser: 3.11 mg/dL — ABNORMAL HIGH (ref 0.50–1.10)

## 2012-08-13 LAB — CBC
HCT: 32.1 % — ABNORMAL LOW (ref 36.0–46.0)
MCH: 27.9 pg (ref 26.0–34.0)
MCV: 88.7 fL (ref 78.0–100.0)
Platelets: 201 10*3/uL (ref 150–400)
RDW: 13.7 % (ref 11.5–15.5)

## 2012-08-13 MED ORDER — INSULIN GLARGINE 100 UNIT/ML ~~LOC~~ SOLN: 11.0000 [IU] | Freq: Every day | SUBCUTANEOUS | Status: DC

## 2012-08-13 MED ORDER — NITROGLYCERIN 0.4 MG SL SUBL
SUBLINGUAL_TABLET | SUBLINGUAL | Status: AC
  Administered 2012-08-13: 0.4 mg
  Filled 2012-08-13: qty 25

## 2012-08-13 MED ORDER — PHENAZOPYRIDINE HCL 100 MG PO TABS
100.0000 mg | ORAL_TABLET | Freq: Three times a day (TID) | ORAL | Status: DC
  Administered 2012-08-13 (×3): 100 mg via ORAL
  Filled 2012-08-13 (×5): qty 1

## 2012-08-13 MED ORDER — ROSUVASTATIN CALCIUM 10 MG PO TABS
10.0000 mg | ORAL_TABLET | Freq: Every day | ORAL | Status: DC
  Administered 2012-08-13 – 2012-08-18 (×6): 10 mg via ORAL
  Filled 2012-08-13 (×7): qty 1

## 2012-08-13 MED ORDER — LACTATED RINGERS IV SOLN: INTRAVENOUS | Status: DC

## 2012-08-13 MED ORDER — FENTANYL CITRATE 0.05 MG/ML IJ SOLN: 25.0000 ug | INTRAMUSCULAR | Status: DC | PRN

## 2012-08-13 MED ORDER — DEXTROSE 5 % IV SOLN
1.0000 g | INTRAVENOUS | Status: DC
  Administered 2012-08-13: 1 g via INTRAVENOUS
  Filled 2012-08-13 (×2): qty 10

## 2012-08-13 MED ORDER — ONDANSETRON HCL 4 MG/2ML IJ SOLN
4.0000 mg | Freq: Four times a day (QID) | INTRAMUSCULAR | Status: DC | PRN
  Administered 2012-08-13 – 2012-08-14 (×3): 4 mg via INTRAVENOUS
  Filled 2012-08-13 (×3): qty 2

## 2012-08-13 MED ORDER — HYDROMORPHONE HCL PF 1 MG/ML IJ SOLN
1.0000 mg | INTRAMUSCULAR | Status: DC | PRN
  Administered 2012-08-13 – 2012-08-19 (×23): 1 mg via INTRAVENOUS
  Filled 2012-08-13 (×23): qty 1

## 2012-08-13 MED ORDER — LORAZEPAM 2 MG/ML IJ SOLN
0.5000 mg | Freq: Four times a day (QID) | INTRAMUSCULAR | Status: DC | PRN
  Administered 2012-08-13 – 2012-08-19 (×7): 0.5 mg via INTRAVENOUS
  Filled 2012-08-13 (×7): qty 1

## 2012-08-13 MED ORDER — HYOSCYAMINE SULFATE 0.125 MG SL SUBL
0.1250 mg | SUBLINGUAL_TABLET | SUBLINGUAL | Status: DC | PRN
  Administered 2012-08-13 – 2012-08-19 (×2): 0.125 mg via SUBLINGUAL
  Filled 2012-08-13 (×4): qty 1

## 2012-08-13 MED ORDER — INSULIN ASPART 100 UNIT/ML ~~LOC~~ SOLN
0.0000 [IU] | Freq: Three times a day (TID) | SUBCUTANEOUS | Status: DC
  Administered 2012-08-13: 2 [IU] via SUBCUTANEOUS
  Administered 2012-08-14 (×2): 3 [IU] via SUBCUTANEOUS
  Administered 2012-08-14: 5 [IU] via SUBCUTANEOUS
  Administered 2012-08-15 (×2): 3 [IU] via SUBCUTANEOUS
  Administered 2012-08-15: 11 [IU] via SUBCUTANEOUS

## 2012-08-13 MED ORDER — HEPARIN (PORCINE) IN NACL 100-0.45 UNIT/ML-% IJ SOLN
1000.0000 [IU]/h | INTRAMUSCULAR | Status: DC
  Administered 2012-08-13: 1000 [IU]/h via INTRAVENOUS
  Filled 2012-08-13: qty 250

## 2012-08-13 MED ORDER — ALPRAZOLAM 0.25 MG PO TABS
0.2500 mg | ORAL_TABLET | Freq: Three times a day (TID) | ORAL | Status: DC | PRN
  Administered 2012-08-13 – 2012-08-18 (×5): 0.25 mg via ORAL
  Filled 2012-08-13 (×7): qty 1

## 2012-08-13 MED ORDER — OXYCODONE-ACETAMINOPHEN 5-325 MG PO TABS
1.0000 | ORAL_TABLET | Freq: Four times a day (QID) | ORAL | Status: DC | PRN
  Administered 2012-08-16 – 2012-08-17 (×5): 1 via ORAL
  Filled 2012-08-13 (×7): qty 1

## 2012-08-13 MED ORDER — NON FORMULARY: 10.0000 mg | Freq: Every day | Status: DC

## 2012-08-13 MED ORDER — HEPARIN BOLUS VIA INFUSION
4000.0000 [IU] | Freq: Once | INTRAVENOUS | Status: AC
  Administered 2012-08-13: 4000 [IU] via INTRAVENOUS
  Filled 2012-08-13: qty 4000

## 2012-08-13 MED ORDER — ASPIRIN 325 MG PO TABS
325.0000 mg | ORAL_TABLET | Freq: Every day | ORAL | Status: DC
  Administered 2012-08-13: 325 mg via ORAL
  Filled 2012-08-13: qty 1

## 2012-08-13 MED ORDER — CARVEDILOL 6.25 MG PO TABS
6.2500 mg | ORAL_TABLET | Freq: Every evening | ORAL | Status: DC
  Administered 2012-08-13: 6.25 mg via ORAL
  Filled 2012-08-13 (×2): qty 1

## 2012-08-13 MED ORDER — BISACODYL 10 MG RE SUPP
10.0000 mg | Freq: Once | RECTAL | Status: AC
  Administered 2012-08-13: 10 mg via RECTAL
  Filled 2012-08-13: qty 1

## 2012-08-13 MED ORDER — INSULIN GLARGINE 100 UNIT/ML ~~LOC~~ SOLN
11.0000 [IU] | Freq: Every day | SUBCUTANEOUS | Status: DC
  Filled 2012-08-13: qty 0.11

## 2012-08-13 MED ORDER — HEPARIN SODIUM (PORCINE) 5000 UNIT/ML IJ SOLN
5000.0000 [IU] | Freq: Three times a day (TID) | INTRAMUSCULAR | Status: DC
  Administered 2012-08-13 (×2): 5000 [IU] via SUBCUTANEOUS
  Filled 2012-08-13 (×4): qty 1

## 2012-08-13 MED ORDER — PHENAZOPYRIDINE HCL 100 MG PO TABS
100.0000 mg | ORAL_TABLET | Freq: Three times a day (TID) | ORAL | Status: DC
  Filled 2012-08-13: qty 1

## 2012-08-13 MED ORDER — PHENOL 1.4 % MT LIQD: 1.0000 | OROMUCOSAL | Status: DC | PRN

## 2012-08-13 MED ORDER — POLYETHYLENE GLYCOL 3350 17 G PO PACK
17.0000 g | PACK | Freq: Two times a day (BID) | ORAL | Status: DC
  Administered 2012-08-13 – 2012-08-19 (×11): 17 g via ORAL
  Filled 2012-08-13 (×14): qty 1

## 2012-08-13 NOTE — Anesthesia Postprocedure Evaluation (Signed)
  Anesthesia Post-op Note  Patient: Kimberly Hardin  Procedure(s) Performed: Procedure(s) (LRB): CYSTOSCOPY WITH RETROGRADE PYELOGRAM/URETERAL STENT PLACEMENT (Bilateral)  Patient Location: PACU  Anesthesia Type: General  Level of Consciousness: awake and alert   Airway and Oxygen Therapy: Patient Spontanous Breathing  Post-op Pain: mild  Post-op Assessment: Post-op Vital signs reviewed, Patient's Cardiovascular Status Stable, Respiratory Function Stable, Patent Airway and No signs of Nausea or vomiting  Last Vitals:  Filed Vitals:   08/13/12 0115  BP: 155/66  Pulse: 85  Temp: 36.4 C  Resp: 20    Post-op Vital Signs: stable   Complications: No apparent anesthesia complications

## 2012-08-13 NOTE — Progress Notes (Addendum)
Kimberly Hardin is a 62 yo WF admitted at [facility] earlier this evening and found to have AKI secondary to bilateral ureteral stones with obstruction. She has a hx of CAD with MI and cardiac stenting having recently been told by her cardiologist that she could stop her Plavix. She has DM II on insulin and Metformin at home which is on hold. Hx HTN on Coreg and Benicar with the Benicar presently on hold due to elevated creatinine. Urology consulted tonight and had pt transferred to [facility] for immediate surgery. She is now on the nsg unit s/p PACU stay and doing well.  S: Doesn't like the catheter, but other than that, she is doing well. Has some neck pain which is chronic. Denies any SOB or chest pain in the perioperative period. No n/v.  O: VS reviewed. Appears well, is slightly somulent, but appropriate, oriented and quickly responds. Skin is pink, warm and dry. Card: S1S2, RRR. Lungs CTA, normal resp effort. Abd obese, soft with + bowel sounds. MOE x 4. Neuro without focal deficit. Extremities warm without edema. Urine is reddish. A/P: 1. Bilateral ureteral stones with obstruction s/p bilateral ureteral stenting. Doing well postop. Added Pyridium to help with bladder spasms. Urology to follow daily. Stents/Foley per them. 2. Hx CAD with cardiac stenting-off Plavix. Will put on ASA 325 daily during the postop period. May need cardio's advice on that. No chest pain or SOB. Cont BB. Hold ARB. 3. AKI secondary to #1-hold ARB. IVF at 100cc/hr tonight. Recheck creat at 0500.  4. Hyperkalemia-had already trended down preop. Recheck at 0500. No sx of acidosis.  5. DM II-advance diet as tolerated and restart insulin as needed. Can restart Metformin at home after 48 hrs. Q4hr CBG's til eating well then Ac and Hs.  6. HTN-stable, cont BB. ARB on hold for now. Can use prns if needed.  7. PPX-Hep sq.  Jimmye Norman, NP [facility] 8. UTI-on Rocephin with culture pending. KJKG, NP

## 2012-08-13 NOTE — Op Note (Signed)
Kimberly Hardin, WHIDBY NO.:  [id number]  MEDICAL RECORD NO.:  [id number]  LOCATION:  1428                         FACILITY:  [facility]  PHYSICIAN:  Sebastian Ache, MD     DATE OF BIRTH:  1950/09/01  DATE OF PROCEDURE:  08/12/2012 DATE OF DISCHARGE:                              OPERATIVE REPORT   PREOPERATIVE DIAGNOSIS:  Bilateral ureteral stones and acute renal failure.  PROCEDURE: 1. Cystoscopy with bilateral retrograde pyelogram with interpretation. 2. Placement of bilateral ureteral stents 6 x 24, no tether.  FINDINGS: 1. Right distal ureteral stone with mild right hydroureteronephrosis. 2. Left proximal ureteral stone with left mild hydronephrosis. 3. Unremarkable urinary bladder.  COMPLICATIONS:  None.  DRAINS: 1. A 16-French Foley catheter straight drain.  INDICATION:  [name] is a pleasant morbidly obese lady without prior history of nephrolithiasis was found, on workup of abdominal pain, to have bilateral ureteral stones and acute renal failure.  Baseline creatinine less than 1 and creatinine now up to greater than 2.7. Options were discussed for urgent renal drainage including nephrostomy versus stenting, is to proceed with a trial of stenting.  Informed consent was obtained and placed in medical record.  DESCRIPTION OF PROCEDURE:  The patient being [name], procedure being cystoscopy, bilateral ureteral stents were confirmed.  Procedure was carried out.  Time-out performed.  The aforementioned antibiotics administered.  General endotracheal anesthesia was introduced.  The patient was placed into a low lithotomy position.  Sterile field was created prepping and draping the patient's vagina, introitus, and proximal thighs using iodine x3.  Next, cystourethroscopy was performed using a 22-French rigid cystoscope with 12-degree offset lens. Inspection of bladder revealed no diverticula, calcifications, papular lesions.  The right ureteral  orifice was gently cannulated using a 6- French end-hole catheter and right retrograde pyelogram was obtained.  Right retrograde pyelogram demonstrated a single right ureter, single system right kidney.  There was a filling defect in the distal ureter consistent with known stone.  There was a proximal mild hydroureteronephrosis.  A 0.038 Sensor wire was advanced at the level of the upper pole over which a new 6 x 24 double-J stent was placed. Proximal distal curl were noted.  Efflux of urine and proteinaceous debris was seen around into the distal end of the stent.  Attention was then directed to the left side.  The left ureteral orifice was cannulated with a 6-French end-hole catheter and left retrograde pyelogram was obtained.  Left retrograde pyelogram demonstrated a single left ureter, single system left kidney.  There was a filling defect in the proximal ureter with known stone with mild hydronephrosis above this.  A 0.038 Sensor wire was advanced at the level of the lower pole over which a new 6 x 24 double-J stent was placed.  Good proximal and distal curl were noted. Efflux of urine was seen around and through the distal end of the stent. The cystoscope was exchanged for a new 16-French Foley catheter per urethra.  Straight drain 10 mL sterile water in the balloon.  Procedure was then terminated.  The patient tolerated the procedure well.  There were no immediate periprocedural complications.  The patient was taken  to the postanesthesia care unit in stable condition with plan for hospitalist admission.          ______________________________ Sebastian Ache, MD     TM/MEDQ  D:  08/13/2012  T:  08/13/2012  Job:  [id number]

## 2012-08-13 NOTE — Progress Notes (Signed)
Report to amanda rn 4 west

## 2012-08-13 NOTE — Progress Notes (Signed)
Update: EKG reviewed immediately and noted to be with definite changes compared to one of 2012. Reviewed EKGs and discussed case with Dr. Conley Rolls who agreed with plan----  Cardio fellow, Dr. Adolm Joseph, called who doesn't think this is a STEMI but likely indicative of diffuse disease and agreed with the EKG changes noted.  Called urology surgeon on call to verify that Heparin was OK to use in face of stents and there is no contraindication. Pt started on Heparin gtt, so po ASA and heparin sq d/c'd. Reviewed with pt her past reactions to statins as all statins are listed as allergy on her chart. Upon clarification, she said Lipitor was the worst for leg pain and she doesn't remember trying Crestor. Therefore, we will start Crestor 10mg . She is presently chest pain free and in NAD. No complaints at this time. VSS. Will cont prn NTG and pt is on a betablocker daily. VS q4hrs tonight and cont tele.  Cardio fellow will come to [facility] to see pt and do formal consult. Trop x 3 are pending.  Discussed plan and findings with pt and husband, Kimberly Needle, who is at the bedside.  Kimberly Norman, NP [facility]

## 2012-08-13 NOTE — Progress Notes (Signed)
   CARE MANAGEMENT NOTE 08/13/2012  Patient:  Kimberly Hardin, Kimberly Hardin   Account Number:  [id number]  Date Initiated:  08/13/2012  Documentation initiated by:  Jiles Crocker  Subjective/Objective Assessment:   ADMITTED FOR SURGERY - CYSTOSCOPY WITH RETROGRADE PYELOGRAM/URETERAL STENT PLACEMENT (Bilateral)     Action/Plan:   PCP: Ezequiel Kayser, MD  LIVES AT HOME WITH SPOUSE; CM FOLLOWING FOR DCP   Anticipated DC Date:  08/16/2012   Anticipated DC Plan:  HOME/SELF CARE      DC Planning Services  CM consult   Status of service:  In process, will continue to follow Medicare Important Message given?  NA - LOS <3 / Initial given by admissions (If response is "NO", the following Medicare IM given date fields will be blank) Per UR Regulation:  Reviewed for med. necessity/level of care/duration of stay Comments:  08/13/2012- B Lakima Dona RN,BSN,MHA

## 2012-08-13 NOTE — Transfer of Care (Signed)
Immediate Anesthesia Transfer of Care Note  Patient: Kimberly Hardin  Procedure(s) Performed: Procedure(s): CYSTOSCOPY WITH RETROGRADE PYELOGRAM/URETERAL STENT PLACEMENT (Bilateral)  Patient Location: PACU  Anesthesia Type:General  Level of Consciousness: awake, alert , oriented and patient cooperative  Airway & Oxygen Therapy: Patient Spontanous Breathing and Patient connected to face mask oxygen  Post-op Assessment: Report given to PACU RN, Post -op Vital signs reviewed and stable and Patient moving all extremities  Post vital signs: Reviewed and stable  Complications: No apparent anesthesia complications

## 2012-08-13 NOTE — Progress Notes (Signed)
RN paged NP secondary to pt c/o chest pain radiating to the left elbow. VSS. No distress, no SOB, diaphoresis, n/v. Relieved in short time after Dilaudid and i NTG SL. EKG done and will compare to old in chart. Cycle troponins. Pt has hx of MI with cardiac stents. Off Plavix now per cardio, but put on ASA 325 during this acute perioperative period. Will follow closely. Jimmye Norman, NP [facility]

## 2012-08-13 NOTE — Consult Note (Signed)
CARDIOLOGY CONSULT NOTE  Patient ID: Kimberly Hardin, MRN: [id number], DOB/AGE: December 16, 1950 62 y.o. Admit date: 08/12/2012 Date of Consult: 08/13/2012  Primary Physician: Ezequiel Kayser, MD Primary Cardiologist: Dr. Verdis Prime  Chief Complaint: chest pain Reason for Consultation: concern for ACS  HPI: 62 y.o. female w/ PMHx significant for DM2, HTN, obesity, CAD s/p MI 2011 with stenting with CFX with repeat in 08/2010 stenting of LAD who was admitted to [facility] on Monday with renal failure secondary to obstruction now s/p bilateral renal stenting on 4/29, no stones reported. This evening she had acute onset of substernal chest pain that radiated to her L arm. EKG performed at the time diffuse ST depressions. Given nitro SL and aspirin was reloaded and chest pain resolved.  Recommended heparin started if cleared by surgery. Recommended statin acutely though reportedly she has had trouble in the past.   Seen after telephone recommendations given. On heparin gtt. Chest pain occurred this evening after she struggled to get to the bathroom. Center of the chest, similar to prior MI pain but not to the same degree. Currently chest pain free. 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 No nitro use in a long time.   ARB held currently due to acute renal failure.   Past Medical History  Diagnosis Date  . MI (myocardial infarction)     2 stents  . Diabetes mellitus without complication   . Hypertension   . Hyperlipidemia   . Allergic rhinitis   . Obesity   . OSA (obstructive sleep apnea)   . Anxiety       Surgical History:  Past Surgical History  Procedure Laterality Date  . Carotid stent insertion    . Eye surgery  3 years ago    laser surgery  . Cystoscopy w/ ureteral stent placement Bilateral 08/12/2012    Procedure: CYSTOSCOPY WITH RETROGRADE PYELOGRAM/URETERAL  STENT PLACEMENT;  Surgeon: Sebastian Ache, MD;  Location: [facility]S;  Service: Urology;  Laterality: Bilateral;     Home Meds: Prior to Admission medications   Medication Sig Start Date End Date Taking? Authorizing Provider  acetaminophen (TYLENOL) 325 MG tablet Take 650 mg by mouth every 6 (six) hours as needed for pain.   Yes Historical Provider, MD  Amino Acids (L-CARNITINE PO) Take 1 tablet by mouth daily.   Yes Historical Provider, MD  amoxicillin-clavulanate (AUGMENTIN) 875-125 MG per tablet Take 1 tablet by mouth every 12 (twelve) hours. 08/08/12  Yes Vida Roller, MD  aspirin 325 MG tablet Take 325-650 mg by mouth daily.   Yes Historical Provider, MD  carvedilol (COREG) 12.5 MG tablet Take 6.25 mg by mouth every evening.   Yes Historical Provider, MD  Cholecalciferol (VITAMIN D-3 PO) Take 200 mg by mouth daily.    Yes Historical Provider, MD  Coenzyme Q10 (COQ10) 200 MG CAPS Take 200 mg by mouth daily.   Yes Historical Provider, MD  Cyanocobalamin (VITAMIN B 12 PO) Take 2 tablets by mouth daily.    Yes Historical Provider, MD  docusate sodium (COLACE) 100 MG capsule Take 1 capsule (100 mg total) by mouth every 12 (twelve) hours. 08/08/12  Yes Vida Roller, MD  insulin aspart (NOVOLOG) 100 UNIT/ML injection Inject 14 Units into the skin 3 (three) times daily with meals.   Yes Historical Provider, MD  insulin glargine (LANTUS) 100 UNIT/ML injection Inject 22 Units into  the skin at bedtime.   Yes Historical Provider, MD  Levocarnitine 250 MG CAPS Take 1 capsule by mouth daily.   Yes Historical Provider, MD  Lysine 500 MG TABS Take 2 tablets by mouth daily.   Yes Historical Provider, MD  metFORMIN (GLUCOPHAGE) 1000 MG tablet Take 1,000 mg by mouth 2 (two) times daily with a meal.   Yes Historical Provider, MD  Multiple Vitamin (MULTIVITAMIN WITH MINERALS) TABS Take 1 tablet by mouth daily.   Yes Historical Provider, MD  naproxen (NAPROSYN) 500 MG tablet Take 1 tablet (500 mg total) by mouth 2  (two) times daily with a meal. 08/08/12  Yes Vida Roller, MD  olmesartan (BENICAR) 40 MG tablet Take 20 mg by mouth daily.    Yes Historical Provider, MD  OVER THE COUNTER MEDICATION Take 1 tablet by mouth 2 (two) times daily. Tumeric Supplement   Yes Historical Provider, MD  oxyCODONE-acetaminophen (PERCOCET/ROXICET) 5-325 MG per tablet Take 0.5-1 tablets by mouth every 4 (four) hours as needed for pain. 08/08/12  Yes Vida Roller, MD  polyethylene glycol powder (GLYCOLAX/MIRALAX) powder Take 17 g by mouth 2 (two) times daily. Until daily soft stools  OTC 08/08/12  Yes Vida Roller, MD    Inpatient Medications:  . carvedilol  6.25 mg Oral QPM  . cefTRIAXone (ROCEPHIN)  IV  1 g Intravenous Q24H  . docusate sodium  100 mg Oral Q12H  . insulin aspart  0-15 Units Subcutaneous TID WC  . polyethylene glycol  17 g Oral BID  . rosuvastatin  10 mg Oral QHS   . sodium chloride 100 mL/hr at 08/13/12 1216  . heparin 1,000 Units/hr (08/13/12 2245)    Allergies:  Allergies  Allergen Reactions  . Darvon (Propoxyphene Hcl) Nausea And Vomiting and Other (See Comments)    Headaches   . Flagyl (Metronidazole) Other (See Comments)    Unknown   . Levaquin (Levofloxacin In D5w) Other (See Comments)    hallucinations  . Minocycline Other (See Comments)    Severe nausea and vomiting, severe diarrhea  . Morphine And Related Other (See Comments)    Seeing hallucinations, out of body experience-floating above her body feeling  . Prednisone Other (See Comments)    Vision disturbances, completely unfocused sight and caused some eyesight damage, after stopped taking med   . Statins Other (See Comments)    Caused muscle cramps  . Sudafed (Pseudoephedrine Hcl) Other (See Comments)    Causes High blood pressure  . Valium (Diazepam) Other (See Comments)    Hallucinations, caused tunnel vision.  . Zoloft (Sertraline Hcl) Other (See Comments)    nightmares    History   Social History  . Marital  Status: Married    Spouse Name: Casimiro Needle    Number of Children: N/A  . Years of Education: N/A   Occupational History  . [employer]   Social History Main Topics  . Smoking status: Never Smoker   . Smokeless tobacco: Never Used  . Alcohol Use: Yes  . Drug Use: No  . Sexually Active: Not on file   Other Topics Concern  . Not on file   Social History Narrative  . No narrative on file     Family History  Problem Relation Age of Onset  . Diabetes Father   . Heart failure Father   . Breast cancer Mother   . Diabetes Sister   . Stomach cancer Maternal Grandfather   . Diabetes Paternal Grandmother   .  Diabetes Paternal Grandfather      Review of Systems: General: negative for chills, fever, night sweats or weight changes.  Cardiovascular: see HPI Dermatological: negative for rash Respiratory: negative for cough or wheezing Urologic: s/p procedure. Abdominal: negative for nausea, vomiting, diarrhea, bright red blood per rectum, melena, or hematemesis Neurologic: negative for visual changes, syncope, or dizziness All other systems reviewed and are otherwise negative except as noted above.  Labs:  Recent Labs  08/13/12 2200  TROPONINI <0.30   Lab Results  Component Value Date   WBC 8.1 08/13/2012   HGB 10.1* 08/13/2012   HCT 32.1* 08/13/2012   MCV 88.7 08/13/2012   PLT 201 08/13/2012    Recent Labs Lab 08/08/12 1406  08/13/12 0509  NA 133*  < > 138  K 6.2*  < > 5.3*  CL 99  < > 104  CO2 23  < > 23  BUN 20  < > 41*  CREATININE 0.83  < > 3.11*  CALCIUM 10.3  < > 8.7  PROT 8.0  --   --   BILITOT 0.3  --   --   ALKPHOS 69  --   --   ALT 35  --   --   AST 60*  --   --   GLUCOSE 284*  < > 127*  < > = values in this interval not displayed. Lab Results  Component Value Date   CHOL  Value: 209        ATP III CLASSIFICATION:  <200     mg/dL   Desirable  578-469  mg/dL   Borderline High  >=629    mg/dL   High       * 09/11/4130   HDL 46 12/07/2009    LDLCALC  Value: 143        Total Cholesterol/HDL:CHD Risk Coronary Heart Disease Risk Table                     Men   Women  1/2 Average Risk   3.4   3.3  Average Risk       5.0   4.4  2 X Average Risk   9.6   7.1  3 X Average Risk  23.4   11.0        Use the calculated Patient Ratio above and the CHD Risk Table to determine the patient's CHD Risk.        ATP III CLASSIFICATION (LDL):  <100     mg/dL   Optimal  440-102  mg/dL   Near or Above                    Optimal  130-159  mg/dL   Borderline  725-366  mg/dL   High  >440     mg/dL   Very High* 3/47/4259   TRIG 102 12/07/2009   No results found for this basename: DDIMER    Radiology/Studies:  Ct Abdomen Pelvis W Contrast  08/12/2012  *RADIOLOGY REPORT*  Clinical Data: Right lower quadrant pain, nausea, vomiting.  CT ABDOMEN AND PELVIS WITH CONTRAST  Technique:  Multidetector CT imaging of the abdomen and pelvis was performed following the standard protocol during bolus administration of intravenous contrast.  Contrast: OMNIPAQUE IOHEXOL 300 MG/ML  SOLN  Comparison: None.  Findings: Heart is normal size.  Lung bases are clear.  No effusions.  Suspect mild fatty infiltration of the liver.  No focal abnormality.  Gallbladder, stomach, spleen,  pancreas, adrenals are unremarkable.  There is an 8 mm stone within the proximal left ureter.  Mild fullness of the left renal collecting system.  3 mm distal right ureteral stone near the right UVJ.  Slight fullness of the right renal collecting system.  Small nonobstructing stone in the lower pole of the right kidney.  Appendix is visualized and is normal. Bowel grossly unremarkable. No free fluid, free air, or adenopathy.  Uterus, adnexa are unremarkable.  Urinary bladder decompressed.  There is a moderate sized umbilical hernia containing fat.  No acute bony abnormality.  Aortic and iliac calcifications.  No aneurysm.  IMPRESSION: 8 mm proximal left ureteral stone.  3 mm distal right ureteral stone.  Mild  fullness of the renal collecting systems bilaterally.  Right nephrolithiasis.  Normal appendix.  Umbilical hernia containing fat.   Original Report Authenticated By: Charlett Nose, M.D.     EKG: sinus, downsloping ST depression in V3-V6, II. Isolated ST elevation in AVL. Repeat pending.  Physical Exam: Blood pressure 158/66, pulse 98, temperature 98.7 F (37.1 C), temperature source Oral, resp. rate 20, height 4\' 11"  (1.499 m), weight 112.537 kg (248 lb 1.6 oz), SpO2 95.00%. General: Well developed, obese Head: Normocephalic, atraumatic, sclera non-icteric, no xanthomas, nares are without discharge.  Neck: Supple. Negative for carotid bruits. JVD not elevated. Lungs: Clear bilaterally to auscultation without wheezes, rales, or rhonchi. Breathing is unlabored. Heart: RRR with S1 S2. No murmurs, rubs, or gallops appreciated. Abdomen: Soft, non-tender, non-distended with normoactive bowel sounds. No hepatomegaly. No rebound/guarding. No obvious abdominal masses. Msk:  Strength and tone appear normal for age. Extremities: No clubbing or cyanosis. No edema.  Distal pedal pulses are 2+ and equal bilaterally. Neuro: Alert and oriented X 3. Moves all extremities spontaneously. Psych:  Responds to questions appropriately with a normal affect. Urine: cath indwelling, bloody urine   Problem List 1. Acute coronary syndrome with chest pain and EKG changes 2. Known CAD s/p MI s/p stents 3. HTN 4. DM2 5. Hyperlipidemia, intolerant of statins (?myalgias) 6. Acute renal failure s/p bilateral renal stents 4/29  Assessment and Plan:  62 y.o. female w/ PMHx significant for DM2, HTN, obesity, CAD s/p MI 2011 with stenting with CFX with repeat in 08/2010 stenting of LAD who was admitted to [facility] on Monday with renal failure secondary to obstruction now s/p bilateral renal stenting on 4/29 with course complicated by chest pain and profound ST depression.  Her ST changes are concerning possible multivessel  or LM disease (specificity of AVL elevation is approx 50% for this). Encouragingly, she is chest pain free and initial troponin negative. Repeat EKG pending to ensure improvement in ST changes.   Continue heparin gtt, aspirin, statin (only intolerant, no allergic, statins have beneficial pleiotropic effects in ACS). Recommend bid beta blocker. Serial troponins pending.  Unfortunately, her acute renal failure (and DM2) puts her at high risk for contrast induced renal failure and would ideally like to defer catheterization at this time until renal function improves.  Summary of Recommendations: 1. Continue heparin gtt, aspirin, statin.  2. Serial troponins 3. Change beta blocker to bid, hold for p < 55, SBP < 90. 4. Echocardiogram in the AM (ordered)  Thank you for this consult please call with questions. Dr. Katrinka Blazing or [facility] will follow up in the AM. Signed, [facility], Yishai Rehfeld C. MD 08/13/2012, 11:47 PM

## 2012-08-13 NOTE — Progress Notes (Signed)
ANTICOAGULATION CONSULT NOTE - Initial Consult  Pharmacy Consult for Heparin Indication: ACS/STEMI  Allergies  Allergen Reactions  . Darvon (Propoxyphene Hcl) Nausea And Vomiting and Other (See Comments)    Headaches   . Flagyl (Metronidazole) Other (See Comments)    Unknown   . Levaquin (Levofloxacin In D5w) Other (See Comments)    hallucinations  . Minocycline Other (See Comments)    Severe nausea and vomiting, severe diarrhea  . Morphine And Related Other (See Comments)    Seeing hallucinations, out of body experience-floating above her body feeling  . Prednisone Other (See Comments)    Vision disturbances, completely unfocused sight and caused some eyesight damage, after stopped taking med   . Statins Other (See Comments)    Caused muscle cramps  . Sudafed (Pseudoephedrine Hcl) Other (See Comments)    Causes High blood pressure  . Valium (Diazepam) Other (See Comments)    Hallucinations, caused tunnel vision.  . Zoloft (Sertraline Hcl) Other (See Comments)    nightmares    Patient Measurements: Height: 4\' 11"  (149.9 cm) Weight: 248 lb 1.6 oz (112.537 kg) IBW/kg (Calculated) : 43.2 Heparin Dosing Weight: 71.6kg  Vital Signs: Temp: 98.7 F (37.1 C) (04/30 2040) Temp src: Oral (04/30 2040) BP: 158/66 mmHg (04/30 2040) Pulse Rate: 98 (04/30 2040)  Labs:  Recent Labs  08/12/12 1705 08/13/12 0509  HGB 11.8* 10.1*  HCT 34.4* 32.1*  PLT 204 201  CREATININE 2.74* 3.11*    Estimated Creatinine Clearance: 21.8 ml/min (by C-G formula based on Cr of 3.11).   Medical History: Past Medical History  Diagnosis Date  . MI (myocardial infarction)     2 stents  . Diabetes mellitus without complication   . Hypertension   . Hyperlipidemia   . Allergic rhinitis   . Obesity   . OSA (obstructive sleep apnea)   . Anxiety     Medications:  Infusions:  . sodium chloride 100 mL/hr at 08/13/12 1216  . [DISCONTINUED] sodium chloride    . [DISCONTINUED] lactated  ringers     PRN: acetaminophen, ALPRAZolam, HYDROmorphone, hyoscyamine, LORazepam, ondansetron (ZOFRAN) IV, oxyCODONE-acetaminophen, phenol, [DISCONTINUED] 0.9 % irrigation (POUR BTL), [DISCONTINUED] fentaNYL, [DISCONTINUED]  HYDROmorphone (DILAUDID) injection, [DISCONTINUED] iohexol, [DISCONTINUED] sterile water for irrigation  Assessment: 62 yo F complaining of radiating chest pain, relieved after dilaudid and SL NTG. Pt is s/p cystoscopy with retrograde pyelogram/ureteral stent placement on 08/12/12. H/H trending down. Plts wnl. SCr worsening. Plan is to cycle cardiac enzymes and start IV heparin for r/o ACS/STEMI. Dosing Wt = 71.6kg.  Goal of Therapy:  Heparin level 0.3-0.7 units/ml Monitor platelets by anticoagulation protocol: Yes   Plan:  1) Heparin bolus 4000 units IV x1 2) Heparin 1000 units/hr 3) Heparin level in 6 hours 4) Daily Heparin level and CBC  Darrol Angel, PharmD Pager: [phone] 08/13/2012,9:53 PM

## 2012-08-13 NOTE — Progress Notes (Signed)
[facility] PROGRESS NOTE  Dannika Hilgeman EXB:[id number] DOB: [birth year] DOA: 08/12/2012 PCP: Ezequiel Kayser, MD  Assessment/Plan: Nephrolithiasis with obstructive uropathy -S/P CYSTOSCOPY WITH RETROGRADE PYELOGRAM/URETERAL STENT PLACEMENT (Bilateral) on 08/13/12 -Appreciate Dr. Berneice Heinrich Acute kidney injury -Likely multifactorial including dehydration, obstruction, and meds (NSAID, ARB) -Continue IV fluids -08/12/2012 CT abdomen shows 8 mm left ureteral stone, 3 mm right ureteral stone Pyuria -Likely due to nephrolithiasis -Continue empiric ceftriaxone -Followup urine culture Diabetes mellitus type 2 -Discontinue metformin -NovoLog sliding scale -Hold Lantus for now Hypertension -Hold ARB due to AKI -Blood pressure remains controlled Hyperkalemia -Mild -Continue IV fluids -Continue telemetry Anxiety -Patient takes alprazolam when necessary home -Restart low-dose alprazolam po, IV ativan if cannot take po Right lower extremity pain and edema -Venous ultrasound rule out DVT Constipation -No BM x1 week -Try bisacodyl Coronary artery disease with history of stent -Cardiac catheterization 09/14/2010 -Finished one year of uninterrupted aspirin and Plavix -Continue aspirin 325 daily     Family Communication:   Husband at beside Disposition Plan:   Home when medically stable   Procedures: -cystoscopy with b/l ureteral stents--08/13/12 Antibiotics:  Ceftriaxone 08/12/2012>>>      Procedures/Studies: Ct Abdomen Pelvis W Contrast  08/12/2012  *RADIOLOGY REPORT*  Clinical Data: Right lower quadrant pain, nausea, vomiting.  CT ABDOMEN AND PELVIS WITH CONTRAST  Technique:  Multidetector CT imaging of the abdomen and pelvis was performed following the standard protocol during bolus administration of intravenous contrast.  Contrast: OMNIPAQUE IOHEXOL 300 MG/ML  SOLN  Comparison: None.  Findings: Heart is normal size.  Lung bases are clear.  No effusions.  Suspect mild  fatty infiltration of the liver.  No focal abnormality.  Gallbladder, stomach, spleen, pancreas, adrenals are unremarkable.  There is an 8 mm stone within the proximal left ureter.  Mild fullness of the left renal collecting system.  3 mm distal right ureteral stone near the right UVJ.  Slight fullness of the right renal collecting system.  Small nonobstructing stone in the lower pole of the right kidney.  Appendix is visualized and is normal. Bowel grossly unremarkable. No free fluid, free air, or adenopathy.  Uterus, adnexa are unremarkable.  Urinary bladder decompressed.  There is a moderate sized umbilical hernia containing fat.  No acute bony abnormality.  Aortic and iliac calcifications.  No aneurysm.  IMPRESSION: 8 mm proximal left ureteral stone.  3 mm distal right ureteral stone.  Mild fullness of the renal collecting systems bilaterally.  Right nephrolithiasis.  Normal appendix.  Umbilical hernia containing fat.   Original Report Authenticated By: Charlett Nose, M.D.          Subjective: Patient states that she feels scared. She appears anxious. Denies fevers, chills, chest pain, shortness breath,, abdominal pain, dizziness, headache. She complains of bilateral flank pain controlled with opioids. Has had nausea with dry heaves.  Objective: Filed Vitals:   08/13/12 0030 08/13/12 0045 08/13/12 0115 08/13/12 0501  BP: 143/59 136/66 155/66 128/42  Pulse: 87 81 85 84  Temp:  98.1 F (36.7 C) 97.6 F (36.4 C) 98.9 F (37.2 C)  TempSrc:    Oral  Resp: 18 20 20 18   Height:   4\' 11"  (1.499 m)   Weight:   112.537 kg (248 lb 1.6 oz)   SpO2: 98% 96% 96% 98%    Intake/Output Summary (Last 24 hours) at 08/13/12 1347 Last data filed at 08/13/12 1100  Gross per 24 hour  Intake    760 ml  Output    960  ml  Net   -200 ml   Weight change:  Exam:   General:  Pt is alert, follows commands appropriately, not in acute distress  HEENT: No icterus, No thrush,Veteran/AT  Cardiovascular: RRR,  S1/S2, no rubs, no gallops  Respiratory: CTA bilaterally, no wheezing, no crackles, no rhonchi  Abdomen: Soft/+BS, non tender, non distended, no guarding  Extremities: 1+ edema, No lymphangitis, No petechiae, No rashes, no synovitis  Data Reviewed: Basic Metabolic Panel:  Recent Labs Lab 08/08/12 1406 08/08/12 1622 08/12/12 1705 08/13/12 0509  NA 133*  --  132* 138  K 6.2* 4.9 5.4* 5.3*  CL 99  --  98 104  CO2 23  --  22 23  GLUCOSE 284*  --  149* 127*  BUN 20  --  38* 41*  CREATININE 0.83  --  2.74* 3.11*  CALCIUM 10.3  --  9.4 8.7   Liver Function Tests:  Recent Labs Lab 08/08/12 1406  AST 60*  ALT 35  ALKPHOS 69  BILITOT 0.3  PROT 8.0  ALBUMIN 3.8    Recent Labs Lab 08/08/12 1406  LIPASE 33   No results found for this basename: AMMONIA,  in the last 168 hours CBC:  Recent Labs Lab 08/08/12 1406 08/12/12 1705 08/13/12 0509  WBC 9.3 10.5 8.1  NEUTROABS 6.5 8.4*  --   HGB 13.1 11.8* 10.1*  HCT 38.3 34.4* 32.1*  MCV 87.6 86.2 88.7  PLT 242 204 201   Cardiac Enzymes: No results found for this basename: CKTOTAL, CKMB, CKMBINDEX, TROPONINI,  in the last 168 hours BNP: No components found with this basename: POCBNP,  CBG:  Recent Labs Lab 08/13/12 0021 08/13/12 0459 08/13/12 0820 08/13/12 1201  GLUCAP 133* 112* 122* 114*    No results found for this or any previous visit (from the past 240 hour(s)).   Scheduled Meds: . aspirin  325 mg Oral Daily  . carvedilol  6.25 mg Oral QPM  . cefTRIAXone (ROCEPHIN)  IV  1 g Intravenous Q24H  . docusate sodium  100 mg Oral Q12H  . heparin  5,000 Units Subcutaneous Q8H  . insulin aspart  0-15 Units Subcutaneous TID WC  . phenazopyridine  100 mg Oral TID WC  . polyethylene glycol  17 g Oral BID   Continuous Infusions: . sodium chloride 100 mL/hr at 08/13/12 1216     Cuinn Westerhold, DO  [facility] Pager [phone]  If 7PM-7AM, please contact night-coverage [email or web address] Password  [email or web address] 08/13/2012, 1:47 PM   LOS: 1 day

## 2012-08-13 NOTE — Progress Notes (Signed)
*  PRELIMINARY RESULTS* Vascular Ultrasound Right lower extremity venous duplex has been completed.  Preliminary findings: Right = no evidence of DVT.  Farrel Demark, RDMS, RVT  08/13/2012, 3:41 PM

## 2012-08-14 ENCOUNTER — Inpatient Hospital Stay (HOSPITAL_COMMUNITY): Payer: 59

## 2012-08-14 DIAGNOSIS — I200 Unstable angina: Secondary | ICD-10-CM

## 2012-08-14 DIAGNOSIS — I25119 Atherosclerotic heart disease of native coronary artery with unspecified angina pectoris: Secondary | ICD-10-CM | POA: Diagnosis present

## 2012-08-14 DIAGNOSIS — I249 Acute ischemic heart disease, unspecified: Secondary | ICD-10-CM

## 2012-08-14 LAB — CBC
MCV: 89.5 fL (ref 78.0–100.0)
Platelets: 190 10*3/uL (ref 150–400)
RDW: 13.5 % (ref 11.5–15.5)
WBC: 8.4 10*3/uL (ref 4.0–10.5)

## 2012-08-14 LAB — BASIC METABOLIC PANEL
Calcium: 8.3 mg/dL — ABNORMAL LOW (ref 8.4–10.5)
Chloride: 104 mEq/L (ref 96–112)
Creatinine, Ser: 2.1 mg/dL — ABNORMAL HIGH (ref 0.50–1.10)
GFR calc Af Amer: 29 mL/min — ABNORMAL LOW (ref >90)
GFR calc non Af Amer: 25 mL/min — ABNORMAL LOW (ref >90)

## 2012-08-14 LAB — HEPARIN LEVEL (UNFRACTIONATED): Heparin Unfractionated: 0.32 IU/mL (ref 0.30–0.70)

## 2012-08-14 LAB — URINE CULTURE
Colony Count: NO GROWTH
Culture: NO GROWTH

## 2012-08-14 LAB — LIPID PANEL
Cholesterol: 160 mg/dL (ref 0–200)
HDL: 40 mg/dL (ref >39)
Total CHOL/HDL Ratio: 4 RATIO
Triglycerides: 78 mg/dL (ref <150)

## 2012-08-14 LAB — TROPONIN I: Troponin I: 4.36 ng/mL (ref <0.30)

## 2012-08-14 MED ORDER — BISACODYL 10 MG RE SUPP
10.0000 mg | Freq: Once | RECTAL | Status: AC
  Administered 2012-08-14: 10 mg via RECTAL
  Filled 2012-08-14: qty 1

## 2012-08-14 MED ORDER — CARVEDILOL 6.25 MG PO TABS
6.2500 mg | ORAL_TABLET | Freq: Two times a day (BID) | ORAL | Status: DC
  Administered 2012-08-14: 6.25 mg via ORAL
  Filled 2012-08-14 (×3): qty 1

## 2012-08-14 MED ORDER — CARVEDILOL 12.5 MG PO TABS
12.5000 mg | ORAL_TABLET | Freq: Two times a day (BID) | ORAL | Status: DC
  Administered 2012-08-14 – 2012-08-15 (×2): 12.5 mg via ORAL
  Filled 2012-08-14 (×4): qty 1

## 2012-08-14 MED ORDER — ASPIRIN EC 325 MG PO TBEC
325.0000 mg | DELAYED_RELEASE_TABLET | Freq: Every day | ORAL | Status: DC
  Administered 2012-08-14 – 2012-08-19 (×6): 325 mg via ORAL
  Filled 2012-08-14 (×6): qty 1

## 2012-08-14 MED ORDER — NITROGLYCERIN 0.3 MG/HR TD PT24
0.3000 mg | MEDICATED_PATCH | Freq: Every day | TRANSDERMAL | Status: DC
  Administered 2012-08-14 – 2012-08-19 (×6): 0.3 mg via TRANSDERMAL
  Filled 2012-08-14 (×7): qty 1

## 2012-08-14 MED ORDER — HEPARIN (PORCINE) IN NACL 100-0.45 UNIT/ML-% IJ SOLN
1450.0000 [IU]/h | INTRAMUSCULAR | Status: DC
  Administered 2012-08-14: 1450 [IU]/h via INTRAVENOUS
  Filled 2012-08-14 (×2): qty 250

## 2012-08-14 MED ORDER — HEPARIN (PORCINE) IN NACL 100-0.45 UNIT/ML-% IJ SOLN
1400.0000 [IU]/h | INTRAMUSCULAR | Status: DC
Start: 2012-08-14 — End: 2012-08-14
  Filled 2012-08-14: qty 250

## 2012-08-14 NOTE — Progress Notes (Addendum)
Patient Name: Kimberly Hardin Date of Encounter: 08/14/2012    SUBJECTIVE: Having no current chest pain. Has ACS with elevated markers to go along with abnormal ECG. Had angina one day last week after lifting a heavy crate of books.  TELEMETRY:  NSR Filed Vitals:   08/13/12 1440 08/13/12 2040 08/14/12 0209 08/14/12 0550  BP: 138/51 158/66 146/55 162/69  Pulse: 77 98 84 84  Temp: 98.7 F (37.1 C) 98.7 F (37.1 C) 99.7 F (37.6 C) 98.4 F (36.9 C)  TempSrc: Oral Oral Oral Oral  Resp: 20 20 20 20   Height:      Weight:      SpO2: 94% 95% 97% 98%    Intake/Output Summary (Last 24 hours) at 08/14/12 0820 Last data filed at 08/14/12 0600  Gross per 24 hour  Intake 3419.17 ml  Output   3600 ml  Net -180.83 ml    LABS: Basic Metabolic Panel:  Recent Labs  04/54/09 0509 08/14/12 0244  NA 138 137  K 5.3* 4.6  CL 104 104  CO2 23 22  GLUCOSE 127* 175*  BUN 41* 35*  CREATININE 3.11* 2.10*  CALCIUM 8.7 8.3*   CBC:  Recent Labs  08/12/12 1705 08/13/12 0509 08/14/12 0244  WBC 10.5 8.1 8.4  NEUTROABS 8.4*  --   --   HGB 11.8* 10.1* 9.6*  HCT 34.4* 32.1* 29.9*  MCV 86.2 88.7 89.5  PLT 204 201 190   Cardiac Enzymes:  Recent Labs  08/13/12 2200 08/14/12 0244  TROPONINI <0.30 1.89*   BNP    Component Value Date/Time   PROBNP 177.0* 04/02/2010 2119    Radiology/Studies:  No new data  Physical Exam: Blood pressure 162/69, pulse 84, temperature 98.4 F (36.9 C), temperature source Oral, resp. rate 20, height 4\' 11"  (1.499 m), weight 112.537 kg (248 lb 1.6 oz), SpO2 98.00%. Weight change:    Not examined  ASSESSMENT:  1. ACS with symptoms, abnormal ECG and positive markers. Likely ISR but progression of native disease can't be excluded. 2. Acute kidney injury due to multifactorial etiology 3. Bilateral ureteral calculi and s/p stents 4. DM, poorly controlled. 5. Obesity  Plan:  1. Add long acting nitrate 2. Increase beta blocker 3. Continue IV  heparin 4. Coronary angiography when clinically appropriate/prudent. Currently in acute kidney failure. This obviously needs to resolve before cath unless we are placed in a STEMI situation. May benefit from nephrology input. 5. If recurrent rest pain, would start IV NTG and move to unit. 6. Will follow  Signed, Lesleigh Noe 08/14/2012, 8:20 AM

## 2012-08-14 NOTE — Progress Notes (Signed)
Echocardiogram 2D Echocardiogram has been performed.  Kimberly Hardin 08/14/2012, 1:08 PM

## 2012-08-14 NOTE — Progress Notes (Signed)
Pt Troponin 4.36.  EKG NSR septal infarct marked ST abnormality.  No complaints of chest pain.  Dr Anne Fu notified.  Continue with current plan of care.  Will continue to monitor.

## 2012-08-14 NOTE — Progress Notes (Signed)
CRITICAL VALUE ALERT  Critical value received: troponin 1.89  Date of notification:  08/14/12  Time of notification: 0345  Critical value read back:yes  Nurse who received alert:  Casilda Carls RN   MD notified (1st page):  Donnamarie Poag NP   Time of first page:  [phone]

## 2012-08-14 NOTE — Progress Notes (Signed)
[facility] PROGRESS NOTE  Anjannette Gauger ZOX:[id number] DOB: 1950/08/27 DOA: 08/12/2012 PCP: Ezequiel Kayser, MD  Assessment/Plan: Nephrolithiasis with obstructive uropathy  -S/P CYSTOSCOPY WITH RETROGRADE PYELOGRAM/URETERAL STENT PLACEMENT (Bilateral) on 08/13/12  -Appreciate Dr. Berneice Heinrich  -Definitive stone management to be set up as outpatient Acute kidney injury  -Likely multifactorial including dehydration (vomiting), obstruction, and meds (NSAID, ARB)  -Patient took approximately 5 doses of naproxen 500 mg and Saturday and date of admission -Continue IV fluids  -Baseline creatinine 0.6-0.8 -08/12/2012 CT abdomen shows 8 mm left ureteral stone, 3 mm right ureteral stone  Acute coronary syndrome -Night of 08/13/2012 Patient had chest discomfort accompanied by abnormal EKG and elevated troponins -Cardiology is following -Continue carvedilol, dose increased -Continue nitrates and heparin drip -Patient is currently pain-free -Cardiac catheterization pending improving renal function -LDL 104 -Patient started on Crestor Pyuria  -Likely due to nephrolithiasis  -Followup urine culture--> negative -Discontinue ceftriaxone  Diabetes mellitus type 2  -Discontinue metformin  -NovoLog sliding scale  -Hold Lantus for now until patient's po continues to improve -Check hemoglobin A1c Hypertension  -Hold ARB due to AKI  -Blood pressure was creeping up, carvedilol increased  Hyperkalemia  -Mild--> improved -Continue IV fluids  -Continue telemetry  Anxiety  -Patient takes alprazolam when necessary home  -Restart low-dose alprazolam po, IV ativan if cannot take po  Right lower extremity pain and edema  -Venous ultrasound rule out DVT--negative Constipation  -No BM x1 week  -Try bisacodyl--patient has had bowel movements Coronary artery disease with history of stent  -Cardiac catheterization 09/14/2010 with DES  -Finished one year of uninterrupted aspirin and Plavix  -Continue  aspirin 325 daily  Family Communication: Husband at beside  Disposition Plan: Home when medically stable  Procedures:  -cystoscopy with b/l ureteral stents--08/13/12  Antibiotics:  Ceftriaxone 08/12/2012>>> 08/14/2012        Procedures/Studies: Ct Abdomen Pelvis W Contrast  08/12/2012  *RADIOLOGY REPORT*  Clinical Data: Right lower quadrant pain, nausea, vomiting.  CT ABDOMEN AND PELVIS WITH CONTRAST  Technique:  Multidetector CT imaging of the abdomen and pelvis was performed following the standard protocol during bolus administration of intravenous contrast.  Contrast: OMNIPAQUE IOHEXOL 300 MG/ML  SOLN  Comparison: None.  Findings: Heart is normal size.  Lung bases are clear.  No effusions.  Suspect mild fatty infiltration of the liver.  No focal abnormality.  Gallbladder, stomach, spleen, pancreas, adrenals are unremarkable.  There is an 8 mm stone within the proximal left ureter.  Mild fullness of the left renal collecting system.  3 mm distal right ureteral stone near the right UVJ.  Slight fullness of the right renal collecting system.  Small nonobstructing stone in the lower pole of the right kidney.  Appendix is visualized and is normal. Bowel grossly unremarkable. No free fluid, free air, or adenopathy.  Uterus, adnexa are unremarkable.  Urinary bladder decompressed.  There is a moderate sized umbilical hernia containing fat.  No acute bony abnormality.  Aortic and iliac calcifications.  No aneurysm.  IMPRESSION: 8 mm proximal left ureteral stone.  3 mm distal right ureteral stone.  Mild fullness of the renal collecting systems bilaterally.  Right nephrolithiasis.  Normal appendix.  Umbilical hernia containing fat.   Original Report Authenticated By: Charlett Nose, M.D.          Subjective: Events of last evening reviewed. Patient currently denies chest pain, shortness breath, nausea, vomiting, diarrhea, abdominal pain. She was able to eat some lunch. Denies any dysuria  hematuria. Still complains  of intermittent flank pain relieved with hydromorphone.  Objective: Filed Vitals:   08/13/12 2040 08/14/12 0209 08/14/12 0550 08/14/12 1029  BP: 158/66 146/55 162/69 131/55  Pulse: 98 84 84 62  Temp: 98.7 F (37.1 C) 99.7 F (37.6 C) 98.4 F (36.9 C) 98.7 F (37.1 C)  TempSrc: Oral Oral Oral Oral  Resp: 20 20 20 18   Height:      Weight:      SpO2: 95% 97% 98% 97%    Intake/Output Summary (Last 24 hours) at 08/14/12 1408 Last data filed at 08/14/12 0600  Gross per 24 hour  Intake 3059.17 ml  Output   2300 ml  Net 759.17 ml   Weight change:  Exam:   General:  Pt is alert, follows commands appropriately, not in acute distress  HEENT: No icterus, No thrush, [place]/AT  Cardiovascular: RRR, S1/S2, no rubs, no gallops  Respiratory: CTA bilaterally, no wheezing, no crackles, no rhonchi  Abdomen: Soft/+BS, non tender, non distended, no guarding  Extremities: trace edema, No lymphangitis, No petechiae, No rashes, no synovitis  Data Reviewed: Basic Metabolic Panel:  Recent Labs Lab 08/08/12 1406 08/08/12 1622 08/12/12 1705 08/13/12 0509 08/14/12 0244  NA 133*  --  132* 138 137  K 6.2* 4.9 5.4* 5.3* 4.6  CL 99  --  98 104 104  CO2 23  --  22 23 22   GLUCOSE 284*  --  149* 127* 175*  BUN 20  --  38* 41* 35*  CREATININE 0.83  --  2.74* 3.11* 2.10*  CALCIUM 10.3  --  9.4 8.7 8.3*   Liver Function Tests:  Recent Labs Lab 08/08/12 1406  AST 60*  ALT 35  ALKPHOS 69  BILITOT 0.3  PROT 8.0  ALBUMIN 3.8    Recent Labs Lab 08/08/12 1406  LIPASE 33   No results found for this basename: AMMONIA,  in the last 168 hours CBC:  Recent Labs Lab 08/08/12 1406 08/12/12 1705 08/13/12 0509 08/14/12 0244  WBC 9.3 10.5 8.1 8.4  NEUTROABS 6.5 8.4*  --   --   HGB 13.1 11.8* 10.1* 9.6*  HCT 38.3 34.4* 32.1* 29.9*  MCV 87.6 86.2 88.7 89.5  PLT 242 204 201 190   Cardiac Enzymes:  Recent Labs Lab 08/13/12 2200 08/14/12 0244  08/14/12 0830  TROPONINI <0.30 1.89* 4.36*   BNP: No components found with this basename: POCBNP,  CBG:  Recent Labs Lab 08/13/12 0820 08/13/12 1201 08/13/12 1657 08/13/12 2050 08/14/12 0734  GLUCAP 122* 114* 121* 203* 170*    Recent Results (from the past 240 hour(s))  URINE CULTURE     Status: None   Collection Time    08/12/12  5:30 PM      Result Value Range Status   Specimen Description URINE, CATHETERIZED   Final   Special Requests NONE   Final   Culture  Setup Time 08/12/2012 20:06   Final   Colony Count NO GROWTH   Final   Culture NO GROWTH   Final   Report Status 08/14/2012 FINAL   Final     Scheduled Meds: . aspirin EC  325 mg Oral Daily  . carvedilol  12.5 mg Oral BID WC  . docusate sodium  100 mg Oral Q12H  . insulin aspart  0-15 Units Subcutaneous TID WC  . nitroGLYCERIN  0.3 mg Transdermal Daily  . polyethylene glycol  17 g Oral BID  . rosuvastatin  10 mg Oral QHS   Continuous Infusions: . sodium  chloride 100 mL/hr at 08/14/12 0735  . heparin       Euriah Matlack, DO  [facility] Pager [phone]  If 7PM-7AM, please contact night-coverage [email or web address] Password [email or web address] 08/14/2012, 2:08 PM   LOS: 2 days

## 2012-08-14 NOTE — Progress Notes (Signed)
Pharmacy Brief Note - heparin level follow-up  61 yof admitted 4/29 with bilateral ureterolithiasis, AKI s/p bilateral ureteral stents placement 4/30. Patient with h/o CAD s/p MI in 2011 with stenting in 2011 and 2012. Patient started c/o CP 4/30, EKG with diffuse ST depresions, IV heparin started for ACS/STEMI.  Awaiting renal fx to improve to perform heart catheretization  Repeat heparin level this evening = 0.34 (goal 0.3-0.5 s/p cystoscopy and placement of BL ureteral stents)  Plan:   Continue current heparin rate at 1450 units/hr as confirmatory level is therapeutic  Follow with am labs  Juliette Alcide, PharmD, BCPS.   Pager: [phone]  08/14/2012 8:42 PM

## 2012-08-14 NOTE — Progress Notes (Signed)
ANTICOAGULATION CONSULT NOTE - Follow Up Consult  Pharmacy Consult for IV heparin Indication: ACS/STEMI  Allergies  Allergen Reactions  . Darvon (Propoxyphene Hcl) Nausea And Vomiting and Other (See Comments)    Headaches   . Flagyl (Metronidazole) Other (See Comments)    Unknown   . Levaquin (Levofloxacin In D5w) Other (See Comments)    hallucinations  . Minocycline Other (See Comments)    Severe nausea and vomiting, severe diarrhea  . Morphine And Related Other (See Comments)    Seeing hallucinations, out of body experience-floating above her body feeling  . Prednisone Other (See Comments)    Vision disturbances, completely unfocused sight and caused some eyesight damage, after stopped taking med   . Statins Other (See Comments)    Caused muscle cramps  . Sudafed (Pseudoephedrine Hcl) Other (See Comments)    Causes High blood pressure  . Valium (Diazepam) Other (See Comments)    Hallucinations, caused tunnel vision.  . Zoloft (Sertraline Hcl) Other (See Comments)    nightmares    Patient Measurements: Height: 4\' 11"  (149.9 cm) Weight: 248 lb 1.6 oz (112.537 kg) IBW/kg (Calculated) : 43.2 Heparin Dosing Weight: 71.6 kg  Vital Signs: Temp: 98.7 F (37.1 C) (05/01 1029) Temp src: Oral (05/01 1029) BP: 131/55 mmHg (05/01 1029) Pulse Rate: 62 (05/01 1029)  Labs:  Recent Labs  08/12/12 1705 08/13/12 0509 08/13/12 2200 08/14/12 0244 08/14/12 0500 08/14/12 0830 08/14/12 1240  HGB 11.8* 10.1*  --  9.6*  --   --   --   HCT 34.4* 32.1*  --  29.9*  --   --   --   PLT 204 201  --  190  --   --   --   HEPARINUNFRC  --   --   --   --  0.15*  --  0.32  CREATININE 2.74* 3.11*  --  2.10*  --   --   --   TROPONINI  --   --  <0.30 1.89*  --  4.36*  --     Estimated Creatinine Clearance: 32.3 ml/min (by C-G formula based on Cr of 2.1).   Assessment:  27 yof admitted 4/29 with bilateral ureterolithiasis, AKI s/p bilateral ureteral stents placement 4/30. Patient with  h/o CAD s/p MI in 2011 with stenting in 2011 and 2012. Patient started c/o CP 4/30, EKG with diffuse ST depresions, IV heparin started for ACS/STEMI.   Heparin is currently running at 1400 units/hr and heparin level is therapeutic at 0.3.  Troponins continue to be elevated. Cardiology deferring catherization at this time given AKI. IV heaprin/ASA/statin/BB on board.  Goal of Therapy:  Heparin level 0.3-0.7 units/ml Monitor platelets by anticoagulation protocol: Yes   Plan:   Increase IV heparin slightly to 1450 units/hr  Confirmatory heparin level at 2000 tonight  Daily heparin level and CBC  Geoffry Paradise, PharmD, BCPS Pager: [phone] 1:26 PM Pharmacy #: [phone]

## 2012-08-14 NOTE — Progress Notes (Signed)
2 Days Post-Op  Subjective:  1 - Bilateral Ureteral Stones - Pt with Rt distal 3mm UVJ stone and Left proximal 8mm stones with bilateral hydro found on CT at [facility] ER on work-up of perisistant abdominal pain x 6 days. No prior stone episodes. S/p cysto bilateral ureteral stents as temporizing measure 08/12/12.  2 - Acute Renal Failure - Baseline Cr <1, now up to 2.74 and with hyperkalemia to 5.4. Pt with acute 8lb weight gain and decreased UOP past few days. Now 2.11 after peak of 3's.  Today Kimberly Hardin is s/o complaints. "Feeling better" overall, but did have episdoe of CP x 1 yesterday with elevated Trop I x 1 with additional pending.    Objective: Vital signs in last 24 hours: Temp:  [98.4 F (36.9 C)-99.7 F (37.6 C)] 98.4 F (36.9 C) (05/01 0550) Pulse Rate:  [77-98] 84 (05/01 0550) Resp:  [20] 20 (05/01 0550) BP: (138-162)/(51-69) 162/69 mmHg (05/01 0550) SpO2:  [94 %-98 %] 98 % (05/01 0550) Last BM Date: 08/13/12  Intake/Output from previous day: 04/30 0701 - 05/01 0700 In: 3419.2 [P.O.:480; I.V.:2889.2; IV Piggyback:50] Out: 3600 [Urine:3600] Intake/Output this shift:    General appearance: alert, cooperative and appears stated age Head: Normocephalic, without obvious abnormality, atraumatic Eyes: conjunctivae/corneas clear. PERRL, EOM's intact. Fundi benign. Ears: normal TM's and external ear canals both ears Nose: Nares normal. Septum midline. Mucosa normal. No drainage or sinus tenderness. Throat: lips, mucosa, and tongue normal; teeth and gums normal Neck: no adenopathy, no carotid bruit, no JVD, supple, symmetrical, trachea midline and thyroid not enlarged, symmetric, no tenderness/mass/nodules Back: symmetric, no curvature. ROM normal. No CVA tenderness. Resp: clear to auscultation bilaterally Cardio: regular rate and rhythm, S1, S2 normal, no murmur, click, rub or gallop Pelvic: Foley c/d/i with dark pink urine (improved) Extremities: extremities normal, atraumatic,  no cyanosis or edema Pulses: 2+ and symmetric Skin: Skin color, texture, turgor normal. No rashes or lesions Lymph nodes: Cervical, supraclavicular, and axillary nodes normal. Neurologic: Grossly normal  Lab Results:   Recent Labs  08/13/12 0509 08/14/12 0244  WBC 8.1 8.4  HGB 10.1* 9.6*  HCT 32.1* 29.9*  PLT 201 190   BMET  Recent Labs  08/13/12 0509 08/14/12 0244  NA 138 137  K 5.3* 4.6  CL 104 104  CO2 23 22  GLUCOSE 127* 175*  BUN 41* 35*  CREATININE 3.11* 2.10*  CALCIUM 8.7 8.3*   PT/INR No results found for this basename: LABPROT, INR,  in the last 72 hours ABG No results found for this basename: PHART, PCO2, PO2, HCO3,  in the last 72 hours  Studies/Results: Ct Abdomen Pelvis W Contrast  08/12/2012  *RADIOLOGY REPORT*  Clinical Data: Right lower quadrant pain, nausea, vomiting.  CT ABDOMEN AND PELVIS WITH CONTRAST  Technique:  Multidetector CT imaging of the abdomen and pelvis was performed following the standard protocol during bolus administration of intravenous contrast.  Contrast: OMNIPAQUE IOHEXOL 300 MG/ML  SOLN  Comparison: None.  Findings: Heart is normal size.  Lung bases are clear.  No effusions.  Suspect mild fatty infiltration of the liver.  No focal abnormality.  Gallbladder, stomach, spleen, pancreas, adrenals are unremarkable.  There is an 8 mm stone within the proximal left ureter.  Mild fullness of the left renal collecting system.  3 mm distal right ureteral stone near the right UVJ.  Slight fullness of the right renal collecting system.  Small nonobstructing stone in the lower pole of the right kidney.  Appendix  is visualized and is normal. Bowel grossly unremarkable. No free fluid, free air, or adenopathy.  Uterus, adnexa are unremarkable.  Urinary bladder decompressed.  There is a moderate sized umbilical hernia containing fat.  No acute bony abnormality.  Aortic and iliac calcifications.  No aneurysm.  IMPRESSION: 8 mm proximal left ureteral  stone.  3 mm distal right ureteral stone.  Mild fullness of the renal collecting systems bilaterally.  Right nephrolithiasis.  Normal appendix.  Umbilical hernia containing fat.   Original Report Authenticated By: Charlett Nose, M.D.     Anti-infectives: Anti-infectives   Start     Dose/Rate Route Frequency Ordered Stop   08/13/12 2100  cefTRIAXone (ROCEPHIN) 1 g in dextrose 5 % 50 mL IVPB     1 g 100 mL/hr over 30 Minutes Intravenous Every 24 hours 08/13/12 0145     08/12/12 2230  cefTRIAXone (ROCEPHIN) 1 g in dextrose 5 % 50 mL IVPB  Status:  Discontinued    Comments:  In OR Holding, perioperative   1 g 100 mL/hr over 30 Minutes Intravenous Every 24 hours 08/12/12 2226 08/13/12 0145   08/12/12 2100  [MAR Hold]  cefTRIAXone (ROCEPHIN) 1 g in dextrose 5 % 50 mL IVPB  Status:  Discontinued     (On MAR Hold since 08/12/12 2314)   1 g 100 mL/hr over 30 Minutes Intravenous Every 24 hours 08/12/12 2054 08/13/12 0041      Assessment/Plan:  1 - Bilateral Ureteral Stones - Now s/p bilateral stents which can be in place for up to 3-4 mos as temporizing measure. Will plan for elective ureteroscopic stone manipulation as outpatient after renal failure resolves and cardiac issues evaluated.  2 - Acute Renal Failure - Likely obstructive from bilateral stones, improving by serum markers s/p stenting.   3 - Will follow, call with questions.   [facility], Kimberly Hardin 08/14/2012

## 2012-08-14 NOTE — Progress Notes (Signed)
Patient complaining of chest pain. BP is 158/66 and HR is 98.  O2 off upon entering room and sats 85%. Applied 2L and sats came up 95%. EKG done and nitro sublingual given x 1. NP on call paged. Will continue to monitor patient.

## 2012-08-14 NOTE — Progress Notes (Deleted)
08/13/12 2040  Vitals  Temp 98.7 F (37.1 C)  Temp src Oral  BP ! 158/66 mmHg  BP Location Left arm  BP Method Automatic  Patient Position, if appropriate Lying  Pulse Rate 98  Pulse Rate Source Dinamap  Resp 20  Oxygen Therapy  SpO2 95 %  O2 Device Nasal cannula  O2 Flow Rate (L/min) 2 L/min

## 2012-08-14 NOTE — Progress Notes (Signed)
ANTICOAGULATION CONSULT NOTE - Initial Consult  Pharmacy Consult for Heparin Indication: ACS/STEMI  Allergies  Allergen Reactions  . Darvon (Propoxyphene Hcl) Nausea And Vomiting and Other (See Comments)    Headaches   . Flagyl (Metronidazole) Other (See Comments)    Unknown   . Levaquin (Levofloxacin In D5w) Other (See Comments)    hallucinations  . Minocycline Other (See Comments)    Severe nausea and vomiting, severe diarrhea  . Morphine And Related Other (See Comments)    Seeing hallucinations, out of body experience-floating above her body feeling  . Prednisone Other (See Comments)    Vision disturbances, completely unfocused sight and caused some eyesight damage, after stopped taking med   . Statins Other (See Comments)    Caused muscle cramps  . Sudafed (Pseudoephedrine Hcl) Other (See Comments)    Causes High blood pressure  . Valium (Diazepam) Other (See Comments)    Hallucinations, caused tunnel vision.  . Zoloft (Sertraline Hcl) Other (See Comments)    nightmares    Patient Measurements: Height: 4\' 11"  (149.9 cm) Weight: 248 lb 1.6 oz (112.537 kg) IBW/kg (Calculated) : 43.2 Heparin Dosing Weight: 71.6kg  Vital Signs: Temp: 98.4 F (36.9 C) (05/01 0550) Temp src: Oral (05/01 0550) BP: 162/69 mmHg (05/01 0550) Pulse Rate: 84 (05/01 0550)  Labs:  Recent Labs  08/12/12 1705 08/13/12 0509 08/13/12 2200 08/14/12 0244 08/14/12 0500  HGB 11.8* 10.1*  --  9.6*  --   HCT 34.4* 32.1*  --  29.9*  --   PLT 204 201  --  190  --   HEPARINUNFRC  --   --   --   --  0.15*  CREATININE 2.74* 3.11*  --  2.10*  --   TROPONINI  --   --  <0.30 1.89*  --     Estimated Creatinine Clearance: 32.3 ml/min (by C-G formula based on Cr of 2.1).   Medical History: Past Medical History  Diagnosis Date  . MI (myocardial infarction)     2 stents  . Diabetes mellitus without complication   . Hypertension   . Hyperlipidemia   . Allergic rhinitis   . Obesity   . OSA  (obstructive sleep apnea)   . Anxiety     Medications:  Infusions:  . sodium chloride 100 mL/hr at 08/13/12 1216  . heparin    . [DISCONTINUED] heparin 1,000 Units/hr (08/13/12 2245)   PRN: acetaminophen, ALPRAZolam, HYDROmorphone, hyoscyamine, LORazepam, ondansetron (ZOFRAN) IV, oxyCODONE-acetaminophen, phenol  Assessment: 62 yo F complaining of radiating chest pain, relieved after dilaudid and SL NTG. Pt is s/p cystoscopy with retrograde pyelogram/ureteral stent placement on 08/12/12. H/H trending down. Plts wnl. SCr worsening. Plan is to cycle cardiac enzymes and start IV heparin for r/o ACS/STEMI. Dosing Wt = 71.6kg. HL=0.15, no bleeding or IV interuptions per RN.  Goal of Therapy:  Heparin level 0.3-0.7 units/ml Monitor platelets by anticoagulation protocol: Yes   Plan:  1) Increase heparin drip to 1400 units/hr 2) Recheck HL in 6 hours 3) Daily Heparin level and CBC   Susanne Greenhouse R 08/14/2012,6:33 AM

## 2012-08-14 NOTE — Progress Notes (Signed)
Update on night events: Pt remains chest pain free. Cardio consult reviewed and Coreg increased to bid and ASA 325mg  put back on in addition to Heparin drip. Statin ordered. 2nd troponin 1.89. Cardio to follow daily. Craige Cotta, NP

## 2012-08-14 NOTE — Progress Notes (Signed)
Patient talked about the difficult time she had had since Friday. She appreciates the care she is getting here at [facility], however. She is a member of [employer] and believes strongly in the efficacy of prayer. She asked for prayer; prayed. Her husband arrived shortly afterwards and then, another friend. Presence; listening; support.

## 2012-08-15 DIAGNOSIS — N201 Calculus of ureter: Principal | ICD-10-CM

## 2012-08-15 LAB — GLUCOSE, CAPILLARY
Glucose-Capillary: 192 mg/dL — ABNORMAL HIGH (ref 70–99)
Glucose-Capillary: 249 mg/dL — ABNORMAL HIGH (ref 70–99)

## 2012-08-15 LAB — CBC
HCT: 27.9 % — ABNORMAL LOW (ref 36.0–46.0)
Hemoglobin: 8.9 g/dL — ABNORMAL LOW (ref 12.0–15.0)
MCH: 28.7 pg (ref 26.0–34.0)
MCHC: 31.9 g/dL (ref 30.0–36.0)
MCV: 90 fL (ref 78.0–100.0)
Platelets: 183 10*3/uL (ref 150–400)
RBC: 3.1 MIL/uL — ABNORMAL LOW (ref 3.87–5.11)
RDW: 13.5 % (ref 11.5–15.5)
WBC: 7.1 10*3/uL (ref 4.0–10.5)

## 2012-08-15 LAB — BASIC METABOLIC PANEL
CO2: 24 mEq/L (ref 19–32)
Calcium: 8.4 mg/dL (ref 8.4–10.5)
Glucose, Bld: 228 mg/dL — ABNORMAL HIGH (ref 70–99)
Sodium: 135 mEq/L (ref 135–145)

## 2012-08-15 LAB — TYPE AND SCREEN

## 2012-08-15 LAB — HEMOGLOBIN A1C: Mean Plasma Glucose: 206 mg/dL — ABNORMAL HIGH (ref <117)

## 2012-08-15 LAB — ABO/RH: ABO/RH(D): A POS

## 2012-08-15 MED ORDER — BISACODYL 10 MG RE SUPP
10.0000 mg | Freq: Once | RECTAL | Status: AC
  Administered 2012-08-15: 10 mg via RECTAL
  Filled 2012-08-15: qty 1

## 2012-08-15 MED ORDER — CARVEDILOL 25 MG PO TABS
25.0000 mg | ORAL_TABLET | Freq: Two times a day (BID) | ORAL | Status: DC
  Administered 2012-08-15 – 2012-08-19 (×8): 25 mg via ORAL
  Filled 2012-08-15 (×10): qty 1

## 2012-08-15 MED ORDER — ENOXAPARIN SODIUM 60 MG/0.6ML ~~LOC~~ SOLN
55.0000 mg | Freq: Every day | SUBCUTANEOUS | Status: DC
  Administered 2012-08-15 – 2012-08-19 (×5): 55 mg via SUBCUTANEOUS
  Filled 2012-08-15 (×5): qty 0.6

## 2012-08-15 MED ORDER — INSULIN ASPART 100 UNIT/ML ~~LOC~~ SOLN
0.0000 [IU] | Freq: Three times a day (TID) | SUBCUTANEOUS | Status: DC
  Administered 2012-08-16 – 2012-08-17 (×4): 3 [IU] via SUBCUTANEOUS
  Administered 2012-08-17 (×2): 8 [IU] via SUBCUTANEOUS
  Administered 2012-08-18 (×2): 5 [IU] via SUBCUTANEOUS
  Administered 2012-08-18 – 2012-08-19 (×2): 3 [IU] via SUBCUTANEOUS
  Administered 2012-08-19: 2 [IU] via SUBCUTANEOUS

## 2012-08-15 MED ORDER — SENNA 8.6 MG PO TABS
2.0000 | ORAL_TABLET | Freq: Every day | ORAL | Status: DC
  Administered 2012-08-15 – 2012-08-19 (×4): 17.2 mg via ORAL
  Filled 2012-08-15: qty 1
  Filled 2012-08-15 (×2): qty 2
  Filled 2012-08-15: qty 1
  Filled 2012-08-15: qty 2

## 2012-08-15 MED ORDER — INSULIN ASPART 100 UNIT/ML ~~LOC~~ SOLN
0.0000 [IU] | Freq: Every day | SUBCUTANEOUS | Status: DC
  Administered 2012-08-15: 2 [IU] via SUBCUTANEOUS
  Administered 2012-08-16: 3 [IU] via SUBCUTANEOUS
  Administered 2012-08-17: 4 [IU] via SUBCUTANEOUS
  Administered 2012-08-18: 2 [IU] via SUBCUTANEOUS

## 2012-08-15 NOTE — Progress Notes (Addendum)
ANTICOAGULATION CONSULT NOTE - Initial Consult  Pharmacy Consult for Lovenox Indication: VTE prophylaxis  Assessment: 23 yof admitted 4/29 with bilateral ureterolithiasis, AKI s/p bilateral ureteral stents placement 4/30. Patient with h/o CAD s/p MI in 2011 with stenting in 2011 and 2012.  Patient started c/o CP 4/30, EKG with diffuse ST depresions, IV heparin started.   Today, 5/2, patient with no c/o CP. cardiology stopping IV heparin and ordered for pharmacy to dose Lovenox for VTE prophylaxis. Per cardiology, if recurrent angina will need to restart therapeutic anticoagulation  Pt is 112 kg, BMI 50, CrCl 39 ml/min. Hgb 8.9. NO bleeding.    Plan:   Stop IV heparin and associated labs  Start Lovenox 55 mg sq q24h for VTE prophylaxis   Pharmacy will sign off  Geoffry Paradise, PharmD, BCPS Pager: [phone] 11:03 AM Pharmacy #: [id number]

## 2012-08-15 NOTE — Progress Notes (Signed)
[facility] PROGRESS NOTE  Kimberly Hardin ZOX:[id number] DOB: 1950-12-16 DOA: 08/12/2012 PCP: Ezequiel Kayser, MD  Assessment/Plan: Nephrolithiasis with obstructive uropathy  -S/P CYSTOSCOPY WITH RETROGRADE PYELOGRAM/URETERAL STENT PLACEMENT (Bilateral) on 08/13/12  -Appreciate Dr. Berneice Heinrich  -Definitive stone management to be set up as outpatient  Acute kidney injury  -continues to improve with IVF -Likely multifactorial including dehydration (vomiting), obstruction, and meds (NSAID, ARB)  -Patient took approximately 5 doses of naproxen 500 mg and Saturday and date of admission  -Continue IV fluids  -Baseline creatinine 0.6-0.8  -08/12/2012 CT abdomen shows 8 mm left ureteral stone, 3 mm right ureteral stone  Acute coronary syndrome/NSTEMI -Night of 08/13/2012 Patient had chest discomfort accompanied by abnormal EKG and elevated troponins  -Cardiology is following  -Continue carvedilol, dose increased  -Continue nitrates and heparin drip  -Patient is currently pain-free  -Cardiac catheterization pending improving renal function  -LDL 104  -Patient started on Crestor  Pyuria  -Likely due to nephrolithiasis  -Followup urine culture--> negative  -Discontinue ceftriaxone  Diabetes mellitus type 2  -Discontinue metformin  -NovoLog sliding scale  -Start Lantus 7 units as  by mouth intake has gradually increased -hemoglobin A1c--8.8 -Patient endorses some noncompliance with diet at home Hypertension  -Hold ARB due to AKI  -Blood pressure was creeping up, carvedilol increased  Hyperkalemia  -Mild--> improved  -Continue IV fluids  -Continue telemetry  -Continue to follow BMP Anxiety  -Patient takes alprazolam when necessary home  -Restart low-dose alprazolam po, IV ativan if cannot take po  Right lower extremity pain and edema  -Venous ultrasound rule out DVT--negative  Constipation  -Try bisacodyl--patient has had small bowel movements  -Add Senokot to Colace -Abdominal  x-ray without obstruction or free air Coronary artery disease with history of stent  -Cardiac catheterization 09/14/2010 with DES  -Finished one year of uninterrupted aspirin and Plavix  -Continue aspirin 325 daily  Family Communication: Husband at beside  Disposition Plan: Home when medically stable  Procedures:  -cystoscopy with b/l ureteral stents--08/13/12  Antibiotics:  Ceftriaxone 08/12/2012>>> 08/14/2012     Family Communication:  husband at beside Disposition Plan:   Home when medically stable         Procedures/Studies: Ct Abdomen Pelvis W Contrast  08/12/2012  *RADIOLOGY REPORT*  Clinical Data: Right lower quadrant pain, nausea, vomiting.  CT ABDOMEN AND PELVIS WITH CONTRAST  Technique:  Multidetector CT imaging of the abdomen and pelvis was performed following the standard protocol during bolus administration of intravenous contrast.  Contrast: OMNIPAQUE IOHEXOL 300 MG/ML  SOLN  Comparison: None.  Findings: Heart is normal size.  Lung bases are clear.  No effusions.  Suspect mild fatty infiltration of the liver.  No focal abnormality.  Gallbladder, stomach, spleen, pancreas, adrenals are unremarkable.  There is an 8 mm stone within the proximal left ureter.  Mild fullness of the left renal collecting system.  3 mm distal right ureteral stone near the right UVJ.  Slight fullness of the right renal collecting system.  Small nonobstructing stone in the lower pole of the right kidney.  Appendix is visualized and is normal. Bowel grossly unremarkable. No free fluid, free air, or adenopathy.  Uterus, adnexa are unremarkable.  Urinary bladder decompressed.  There is a moderate sized umbilical hernia containing fat.  No acute bony abnormality.  Aortic and iliac calcifications.  No aneurysm.  IMPRESSION: 8 mm proximal left ureteral stone.  3 mm distal right ureteral stone.  Mild fullness of the renal collecting systems bilaterally.  Right  nephrolithiasis.  Normal appendix.   Umbilical hernia containing fat.   Original Report Authenticated By: Charlett Nose, M.D.    Dg Abd Portable 2v  08/15/2012  *RADIOLOGY REPORT*  Clinical Data: Abdominal pain and constipation.  Abdominal distension.  PORTABLE ABDOMEN - 2 VIEW  Comparison: CT abdomen and pelvis 08/12/2012  Findings: Study is technically limited due to the patient's body habitus.  Interval placement of bilateral ureteral stents with proximal pigtails projected over the expected location of the kidneys and distal pigtails projected over the expected location of the bladder.  Renal stones demonstrated CT are not definitely shown radiographically. Scattered gas and stool in the colon.  No small or large bowel distension.  No free intra-abdominal air.  No abnormal air fluid levels.  IMPRESSION: Bilateral ureteral stents.  Nonobstructive bowel gas pattern.   Original Report Authenticated By: Burman Nieves, M.D.          Subjective: Patient continues to complain of intermittent lower, pain. No dysuria. Denies any fevers, chills, chest pain, shortness of breath, nausea, vomiting. No dizziness. She had a small bowel movement this morning. She is passing flatus.  Objective: Filed Vitals:   08/14/12 1810 08/14/12 2115 08/15/12 0106 08/15/12 0530  BP: 148/55 154/61 137/52 130/52  Pulse: 70 71 66 57  Temp:  98.6 F (37 C) 98.7 F (37.1 C) 98.3 F (36.8 C)  TempSrc:  Oral Oral Oral  Resp:  20 20 16   Height:      Weight:      SpO2:  98% 97% 97%    Intake/Output Summary (Last 24 hours) at 08/15/12 1122 Last data filed at 08/15/12 0532  Gross per 24 hour  Intake 1606.74 ml  Output   1500 ml  Net 106.74 ml   Weight change:  Exam:   General:  Pt is alert, follows commands appropriately, not in acute distress  HEENT: No icterus, No thrush, [place]/AT  Cardiovascular: RRR, S1/S2, no rubs, no gallops  Respiratory: CTA bilaterally, no wheezing, no crackles, no rhonchi  Abdomen: Soft/+BS, mild tenderness to  palpation left lower quadrant, right lower quadrant without any peritoneal signs or guarding, non distended, no guarding  Extremities: 1+ edema, No lymphangitis, No petechiae, No rashes, no synovitis  Data Reviewed: Basic Metabolic Panel:  Recent Labs Lab 08/08/12 1406 08/08/12 1622 08/12/12 1705 08/13/12 0509 08/14/12 0244 08/15/12 0500  NA 133*  --  132* 138 137 135  K 6.2* 4.9 5.4* 5.3* 4.6 5.0  CL 99  --  98 104 104 104  CO2 23  --  22 23 22 24   GLUCOSE 284*  --  149* 127* 175* 228*  BUN 20  --  38* 41* 35* 35*  CREATININE 0.83  --  2.74* 3.11* 2.10* 1.71*  CALCIUM 10.3  --  9.4 8.7 8.3* 8.4   Liver Function Tests:  Recent Labs Lab 08/08/12 1406  AST 60*  ALT 35  ALKPHOS 69  BILITOT 0.3  PROT 8.0  ALBUMIN 3.8    Recent Labs Lab 08/08/12 1406  LIPASE 33   No results found for this basename: AMMONIA,  in the last 168 hours CBC:  Recent Labs Lab 08/08/12 1406 08/12/12 1705 08/13/12 0509 08/14/12 0244 08/15/12 0500  WBC 9.3 10.5 8.1 8.4 7.1  NEUTROABS 6.5 8.4*  --   --   --   HGB 13.1 11.8* 10.1* 9.6* 8.9*  HCT 38.3 34.4* 32.1* 29.9* 27.9*  MCV 87.6 86.2 88.7 89.5 90.0  PLT 242 204 201 190 183  Cardiac Enzymes:  Recent Labs Lab 08/13/12 2200 08/14/12 0244 08/14/12 0830  TROPONINI <0.30 1.89* 4.36*   BNP: No components found with this basename: POCBNP,  CBG:  Recent Labs Lab 08/14/12 0734 08/14/12 1204 08/14/12 1718 08/14/12 2120 08/15/12 0704  GLUCAP 170* 218* 232* 192* 199*    Recent Results (from the past 240 hour(s))  URINE CULTURE     Status: None   Collection Time    08/12/12  5:30 PM      Result Value Range Status   Specimen Description URINE, CATHETERIZED   Final   Special Requests NONE   Final   Culture  Setup Time 08/12/2012 20:06   Final   Colony Count NO GROWTH   Final   Culture NO GROWTH   Final   Report Status 08/14/2012 FINAL   Final     Scheduled Meds: . aspirin EC  325 mg Oral Daily  . bisacodyl  10 mg  Rectal Once  . carvedilol  25 mg Oral BID WC  . docusate sodium  100 mg Oral Q12H  . enoxaparin (LOVENOX) injection  55 mg Subcutaneous Daily  . insulin aspart  0-15 Units Subcutaneous TID WC  . nitroGLYCERIN  0.3 mg Transdermal Daily  . polyethylene glycol  17 g Oral BID  . rosuvastatin  10 mg Oral QHS  . senna  2 tablet Oral Daily   Continuous Infusions: . sodium chloride 100 mL/hr at 08/15/12 0715     Asuncion Shibata, DO  [facility] Pager [phone]  If 7PM-7AM, please contact night-coverage [email or web address] Password [email or web address] 08/15/2012, 11:22 AM   LOS: 3 days

## 2012-08-15 NOTE — Progress Notes (Signed)
Inpatient Diabetes Program Recommendations  AACE/ADA: New Consensus Statement on Inpatient Glycemic Control (2013)  Target Ranges:  Prepandial:   less than 140 mg/dL      Peak postprandial:   less than 180 mg/dL (1-2 hours)      Critically ill patients:  140 - 180 mg/dL  Inpatient Diabetes Program Recommendations Insulin - Basal: Pt takes 22 units lantus at home.  Please start with 15-20 units at HS Insulin - Meal Coverage: Pt takes Novolog 14 units tidwc at home. Please start novolog meal coverage of 4 units to starte tidwc.  Thank you, Lenor Coffin, RN, CNS, Diabetes Coordinator [phone])

## 2012-08-15 NOTE — Progress Notes (Signed)
Patient Name: Kimberly Hardin Date of Encounter: 08/15/2012    SUBJECTIVE: She has had no recurrence of chest pain. She denies dyspnea and palpitations. The husband is present in the room. She has left lower quadrant discomfort. Red urine as noted in the Foley bag.  TELEMETRY:  Normal sinus rhythm Filed Vitals:   08/14/12 1810 08/14/12 2115 08/15/12 0106 08/15/12 0530  BP: 148/55 154/61 137/52 130/52  Pulse: 70 71 66 57  Temp:  98.6 F (37 C) 98.7 F (37.1 C) 98.3 F (36.8 C)  TempSrc:  Oral Oral Oral  Resp:  20 20 16   Height:      Weight:      SpO2:  98% 97% 97%    Intake/Output Summary (Last 24 hours) at 08/15/12 1038 Last data filed at 08/15/12 0532  Gross per 24 hour  Intake 1606.74 ml  Output   1500 ml  Net 106.74 ml    LABS: Basic Metabolic Panel:  Recent Labs  16/10/96 0244 08/15/12 0500  NA 137 135  K 4.6 5.0  CL 104 104  CO2 22 24  GLUCOSE 175* 228*  BUN 35* 35*  CREATININE 2.10* 1.71*  CALCIUM 8.3* 8.4   CBC:  Recent Labs  08/12/12 1705  08/14/12 0244 08/15/12 0500  WBC 10.5  < > 8.4 7.1  NEUTROABS 8.4*  --   --   --   HGB 11.8*  < > 9.6* 8.9*  HCT 34.4*  < > 29.9* 27.9*  MCV 86.2  < > 89.5 90.0  PLT 204  < > 190 183  < > = values in this interval not displayed. Cardiac Enzymes:  Recent Labs  08/13/12 2200 08/14/12 0244 08/14/12 0830  TROPONINI <0.30 1.89* 4.36*   Fasting Lipid Panel:  Recent Labs  08/14/12 0205  CHOL 160  HDL 40  LDLCALC 104*  TRIG 78  CHOLHDL 4.0   ECG: normal sinus rhythm unchanged from typical baseline with T-wave inversion in lead I and aVL  Radiology/Studies:  No new  ECHOCARDIOGRAM: 08/14/12 Study Conclusions  - Left ventricle: The cavity size was normal. There was mild focal basal hypertrophy of the septum. Systolic function was normal. The estimated ejection fraction was in the range of 50% to 55%. Possible hypokinesis of the lateral myocardium. Doppler parameters are consistent with abnormal  left ventricular relaxation (grade 1 diastolic dysfunction). - Mitral valve: Calcified annulus.   Physical Exam: Blood pressure 130/52, pulse 57, temperature 98.3 F (36.8 C), temperature source Oral, resp. rate 16, height 4\' 11"  (1.499 m), weight 112.537 kg (248 lb 1.6 oz), SpO2 97.00%. Weight change:    Marked obesity  Decreased breath sounds bilaterally  S4 gallop  Cranberry-colored urine in her Foley bag  ASSESSMENT:  1. Non-ST elevation myocardial infarction likely due to restenosis of the LAD stent. Rule out progression of disease  2. Continued abdominal discomfort and hematuria, due to ureteral calculi  3. Acute kidney injury, improving  4. Acute blood loss anemia  Plan:  1. Continue beta blocker, long-acting nitrate, and aspirin.  2. Change heparin to DVT prophylaxis. If recurrent angina will need to restart therapeutic anticoagulation  3. Continue to monitor renal function and avoid contrast if possible for the next 5-7 days.  4. Type and screen and consider transplantation his hemoglobin drops less than 8.5. She will need IV Lasix to prevent heart failure. If transfusion required, she will probably need to move to the unit.  Kimberly Hardin 08/15/2012, 10:38 AM

## 2012-08-15 NOTE — Progress Notes (Signed)
3 Days Post-Op  Subjective:  1 - Bilateral Ureteral Stones - Pt with Rt distal 3mm UVJ stone and Left proximal 8mm stones with bilateral hydro found on CT at [facility] ER on work-up of perisistant abdominal pain x 6 days. No prior stone episodes. S/p cysto bilateral ureteral stents as temporizing measure 08/12/12.  2 - Acute Renal Failure - Baseline Cr <1,  up to 2.74 and with hyperkalemia to 5.4. Pt with acute 8lb weight gain and decreased UOP as well. Now 1.7 after peak of 3's.  Today Kimberly Hardin is feeling stonger. No more chest pain. Hematuria improved off hparin gtt.  Objective: Vital signs in last 24 hours: Temp:  [98.3 F (36.8 C)-98.9 F (37.2 C)] 98.9 F (37.2 C) (05/02 1353) Pulse Rate:  [57-71] 65 (05/02 1353) Resp:  [14-20] 14 (05/02 1353) BP: (128-154)/(48-61) 128/59 mmHg (05/02 1357) SpO2:  [97 %-99 %] 99 % (05/02 1353) Last BM Date: 08/15/12  Intake/Output from previous day: 05/01 0701 - 05/02 0700 In: 1846.7 [P.O.:480; I.V.:1366.7] Out: 1500 [Urine:1500] Intake/Output this shift: Total I/O In: 2404.5 [I.V.:2404.5] Out: 400 [Urine:400]  General appearance: alert, cooperative and appears stated age Head: Normocephalic, without obvious abnormality, atraumatic Eyes: conjunctivae/corneas clear. PERRL, EOM's intact. Fundi benign. Ears: normal TM's and external ear canals both ears Nose: Nares normal. Septum midline. Mucosa normal. No drainage or sinus tenderness. Throat: lips, mucosa, and tongue normal; teeth and gums normal Neck: no adenopathy, no carotid bruit, no JVD, supple, symmetrical, trachea midline and thyroid not enlarged, symmetric, no tenderness/mass/nodules Back: symmetric, no curvature. ROM normal. No CVA tenderness. Resp: clear to auscultation bilaterally Chest wall: no tenderness Cardio: regular rate and rhythm, S1, S2 normal, no murmur, click, rub or gallop GI: soft, non-tender; bowel sounds normal; no masses,  no organomegaly Female genitalia: normal, foley  with light pink urine Pelvic: external genitalia normal Extremities: extremities normal, atraumatic, no cyanosis or edema Pulses: 2+ and symmetric Skin: Skin color, texture, turgor normal. No rashes or lesions Lymph nodes: Cervical, supraclavicular, and axillary nodes normal. Neurologic: Grossly normal Incision/Wound:  Lab Results:   Recent Labs  08/14/12 0244 08/15/12 0500  WBC 8.4 7.1  HGB 9.6* 8.9*  HCT 29.9* 27.9*  PLT 190 183   BMET  Recent Labs  08/14/12 0244 08/15/12 0500  NA 137 135  K 4.6 5.0  CL 104 104  CO2 22 24  GLUCOSE 175* 228*  BUN 35* 35*  CREATININE 2.10* 1.71*  CALCIUM 8.3* 8.4   PT/INR No results found for this basename: LABPROT, INR,  in the last 72 hours ABG No results found for this basename: PHART, PCO2, PO2, HCO3,  in the last 72 hours  Studies/Results: Dg Abd Portable 2v  08/15/2012  *RADIOLOGY REPORT*  Clinical Data: Abdominal pain and constipation.  Abdominal distension.  PORTABLE ABDOMEN - 2 VIEW  Comparison: CT abdomen and pelvis 08/12/2012  Findings: Study is technically limited due to the patient's body habitus.  Interval placement of bilateral ureteral stents with proximal pigtails projected over the expected location of the kidneys and distal pigtails projected over the expected location of the bladder.  Renal stones demonstrated CT are not definitely shown radiographically. Scattered gas and stool in the colon.  No small or large bowel distension.  No free intra-abdominal air.  No abnormal air fluid levels.  IMPRESSION: Bilateral ureteral stents.  Nonobstructive bowel gas pattern.   Original Report Authenticated By: Burman Nieves, M.D.     Anti-infectives: Anti-infectives   Start     Dose/Rate  Route Frequency Ordered Stop   08/13/12 2100  cefTRIAXone (ROCEPHIN) 1 g in dextrose 5 % 50 mL IVPB  Status:  Discontinued     1 g 100 mL/hr over 30 Minutes Intravenous Every 24 hours 08/13/12 0145 08/14/12 1406   08/12/12 2230  cefTRIAXone  (ROCEPHIN) 1 g in dextrose 5 % 50 mL IVPB  Status:  Discontinued    Comments:  In OR Holding, perioperative   1 g 100 mL/hr over 30 Minutes Intravenous Every 24 hours 08/12/12 2226 08/13/12 0145   08/12/12 2100  [MAR Hold]  cefTRIAXone (ROCEPHIN) 1 g in dextrose 5 % 50 mL IVPB  Status:  Discontinued     (On MAR Hold since 08/12/12 2314)   1 g 100 mL/hr over 30 Minutes Intravenous Every 24 hours 08/12/12 2054 08/13/12 0041      Assessment/Plan:  1 - Bilateral Ureteral Stones - Now s/p bilateral stents which can be in place for up to 3-4 mos as temporizing measure. Will plan for elective ureteroscopic stone manipulation as outpatient after renal failure resolves and cardiac issues evaluated. Will certainly need cardiac clearance prior.  2 - Acute Renal Failure - Likely obstructive from bilateral stones, improving by serum markers s/p stenting.   3 - Will follow, call with questions.    [facility], Tarsha Blando 08/15/2012

## 2012-08-16 LAB — IRON AND TIBC
Iron: 29 ug/dL — ABNORMAL LOW (ref 42–135)
UIBC: 241 ug/dL (ref 125–400)

## 2012-08-16 LAB — GLUCOSE, CAPILLARY: Glucose-Capillary: 296 mg/dL — ABNORMAL HIGH (ref 70–99)

## 2012-08-16 LAB — CBC
MCH: 28.8 pg (ref 26.0–34.0)
MCV: 89.6 fL (ref 78.0–100.0)
Platelets: 177 10*3/uL (ref 150–400)
RBC: 2.99 MIL/uL — ABNORMAL LOW (ref 3.87–5.11)
RDW: 13.5 % (ref 11.5–15.5)

## 2012-08-16 LAB — BASIC METABOLIC PANEL
CO2: 21 mEq/L (ref 19–32)
Calcium: 8.6 mg/dL (ref 8.4–10.5)
Creatinine, Ser: 1.65 mg/dL — ABNORMAL HIGH (ref 0.50–1.10)
GFR calc Af Amer: 38 mL/min — ABNORMAL LOW (ref >90)
GFR calc non Af Amer: 33 mL/min — ABNORMAL LOW (ref >90)
Sodium: 137 mEq/L (ref 135–145)

## 2012-08-16 LAB — FERRITIN: Ferritin: 132 ng/mL (ref 10–291)

## 2012-08-16 MED ORDER — INSULIN GLARGINE 100 UNIT/ML ~~LOC~~ SOLN
8.0000 [IU] | Freq: Every day | SUBCUTANEOUS | Status: DC
  Administered 2012-08-16: 8 [IU] via SUBCUTANEOUS
  Filled 2012-08-16 (×2): qty 0.08

## 2012-08-16 MED ORDER — INSULIN ASPART 100 UNIT/ML ~~LOC~~ SOLN
2.0000 [IU] | Freq: Three times a day (TID) | SUBCUTANEOUS | Status: DC
  Administered 2012-08-16 – 2012-08-17 (×3): 2 [IU] via SUBCUTANEOUS

## 2012-08-16 NOTE — Progress Notes (Signed)
No pain from stents Urinating well F/up with Dr Berneice Heinrich when OK to go home Labs reviewed Cr 1.65

## 2012-08-16 NOTE — Progress Notes (Addendum)
SUBJECTIVE:  No complaints of SOB or chest pain  OBJECTIVE:   Vitals:   Filed Vitals:   08/15/12 2034 08/16/12 0200 08/16/12 0451 08/16/12 0808  BP: 152/54 121/69 154/67 123/50  Pulse: 64 55 58 61  Temp: 98.9 F (37.2 C) 97.1 F (36.2 C) 97.6 F (36.4 C)   TempSrc: Oral Oral Oral   Resp: 18 20 18    Height:      Weight:      SpO2: 99% 99% 99%    I&O's:   Intake/Output Summary (Last 24 hours) at 08/16/12 0841 Last data filed at 08/16/12 0820  Gross per 24 hour  Intake 2504.5 ml  Output   1200 ml  Net 1304.5 ml   TELEMETRY: Reviewed telemetry pt in NSR:     PHYSICAL EXAM General: Well developed, well nourished, in no acute distress Head: Eyes PERRLA, No xanthomas.   Normal cephalic and atramatic  Lungs:   Clear bilaterally to auscultation and percussion. Heart:   HRRR S1 S2 Pulses are 2+ & equal. Abdomen: Bowel sounds are positive, abdomen soft and non-tender without masses Extremities:   1+ edema bilaterally Neuro: Alert and oriented X 3. Psych:  Good affect, responds appropriately   LABS: Basic Metabolic Panel:  Recent Labs  40/98/11 0500 08/16/12 0415  NA 135 137  K 5.0 4.9  CL 104 107  CO2 24 21  GLUCOSE 228* 219*  BUN 35* 35*  CREATININE 1.71* 1.65*  CALCIUM 8.4 8.6   Liver Function Tests: No results found for this basename: AST, ALT, ALKPHOS, BILITOT, PROT, ALBUMIN,  in the last 72 hours No results found for this basename: LIPASE, AMYLASE,  in the last 72 hours CBC:  Recent Labs  08/15/12 0500 08/16/12 0415  WBC 7.1 5.7  HGB 8.9* 8.6*  HCT 27.9* 26.8*  MCV 90.0 89.6  PLT 183 177   Cardiac Enzymes:  Recent Labs  08/13/12 2200 08/14/12 0244 08/14/12 0830  TROPONINI <0.30 1.89* 4.36*   BNP: No components found with this basename: POCBNP,  D-Dimer: No results found for this basename: DDIMER,  in the last 72 hours Hemoglobin A1C:  Recent Labs  08/15/12 0500  HGBA1C 8.8*   Fasting Lipid Panel:  Recent Labs  08/14/12 0205   CHOL 160  HDL 40  LDLCALC 104*  TRIG 78  CHOLHDL 4.0   Thyroid Function Tests: No results found for this basename: TSH, T4TOTAL, FREET3, T3FREE, THYROIDAB,  in the last 72 hours Anemia Panel: No results found for this basename: VITAMINB12, FOLATE, FERRITIN, TIBC, IRON, RETICCTPCT,  in the last 72 hours Coag Panel:   Lab Results  Component Value Date   INR 0.89 04/02/2010   INR 0.88 12/07/2009    RADIOLOGY: Ct Abdomen Pelvis W Contrast  08/12/2012  *RADIOLOGY REPORT*  Clinical Data: Right lower quadrant pain, nausea, vomiting.  CT ABDOMEN AND PELVIS WITH CONTRAST  Technique:  Multidetector CT imaging of the abdomen and pelvis was performed following the standard protocol during bolus administration of intravenous contrast.  Contrast: OMNIPAQUE IOHEXOL 300 MG/ML  SOLN  Comparison: None.  Findings: Heart is normal size.  Lung bases are clear.  No effusions.  Suspect mild fatty infiltration of the liver.  No focal abnormality.  Gallbladder, stomach, spleen, pancreas, adrenals are unremarkable.  There is an 8 mm stone within the proximal left ureter.  Mild fullness of the left renal collecting system.  3 mm distal right ureteral stone near the right UVJ.  Slight fullness of the right  renal collecting system.  Small nonobstructing stone in the lower pole of the right kidney.  Appendix is visualized and is normal. Bowel grossly unremarkable. No free fluid, free air, or adenopathy.  Uterus, adnexa are unremarkable.  Urinary bladder decompressed.  There is a moderate sized umbilical hernia containing fat.  No acute bony abnormality.  Aortic and iliac calcifications.  No aneurysm.  IMPRESSION: 8 mm proximal left ureteral stone.  3 mm distal right ureteral stone.  Mild fullness of the renal collecting systems bilaterally.  Right nephrolithiasis.  Normal appendix.  Umbilical hernia containing fat.   Original Report Authenticated By: Charlett Nose, M.D.    Dg Abd Portable 2v  08/15/2012  *RADIOLOGY  REPORT*  Clinical Data: Abdominal pain and constipation.  Abdominal distension.  PORTABLE ABDOMEN - 2 VIEW  Comparison: CT abdomen and pelvis 08/12/2012  Findings: Study is technically limited due to the patient's body habitus.  Interval placement of bilateral ureteral stents with proximal pigtails projected over the expected location of the kidneys and distal pigtails projected over the expected location of the bladder.  Renal stones demonstrated CT are not definitely shown radiographically. Scattered gas and stool in the colon.  No small or large bowel distension.  No free intra-abdominal air.  No abnormal air fluid levels.  IMPRESSION: Bilateral ureteral stents.  Nonobstructive bowel gas pattern.   Original Report Authenticated By: Burman Nieves, M.D.     ASSESSMENT:  1. Non-ST elevation myocardial infarction likely due to restenosis of the LAD stent.  2. Continued abdominal discomfort and hematuria, due to ureteral calculi  3. Acute kidney injury, improving  4. Acute blood loss anemia  5.  Mild LE edema with clear lungs Plan:  1. Continue beta blocker, long-acting nitrate, and aspirin.  3. Continue to monitor renal function and avoid contrast if possible for the next 5-7 days.  4. Type and screen and consider transfusion if hemoglobin drops less than 8.5. She will need IV Lasix to prevent heart failure. If transfusion required, she will probably need to move to the unit.  5.  Check BNP     Quintella Reichert, MD  08/16/2012  8:41 AM

## 2012-08-16 NOTE — Progress Notes (Signed)
[facility] PROGRESS NOTE  Kimberly Hardin ZOX:[id number] DOB: 12-19-1950 DOA: 08/12/2012 PCP: Ezequiel Kayser, MD  Assessment/Plan: Nephrolithiasis with obstructive uropathy  -S/P CYSTOSCOPY WITH RETROGRADE PYELOGRAM/URETERAL STENT PLACEMENT (Bilateral) on 08/13/12  -Appreciate Dr. Berneice Heinrich  -Definitive stone management to be set up as outpatient  Acute kidney injury  -continues to improve with IVF  -Decrease rate of IV fluids as patient's oral intake has improved -I/O is +1704cc in past 24hrs -Likely multifactorial including dehydration (vomiting), obstruction, and meds (NSAID, ARB)  -Patient took approximately 5 doses of naproxen 500 mg and Saturday and date of admission  -Continue IV fluids  -Baseline creatinine 0.6-0.8  -08/12/2012 CT abdomen shows 8 mm left ureteral stone, 3 mm right ureteral stone  Acute coronary syndrome/NSTEMI  -Night of 08/13/2012 Patient had chest discomfort accompanied by abnormal EKG and elevated troponins  -Cardiology is following  -Continue carvedilol, dose increased  -Continue nitrates -Heparin drip discontinued (08/15/2012) -Patient is currently pain-free  -Cardiac catheterization pending improving renal function  -LDL 104  -Patient started on Crestor  Normocytic anemia -Baseline hemoglobin 11-12 -Likely dilutional as all cell lines have decreased -Remained hemodynamically stable -Check iron/TIBC/ferritin Pyuria  -Likely due to nephrolithiasis  -Followup urine culture--> negative  -Discontinue ceftriaxone  Diabetes mellitus type 2  -Discontinue metformin  -NovoLog sliding scale  -NovoLog 2 units pre-meal -Start Lantus 8 units as by mouth intake has gradually increased  -hemoglobin A1c--8.8  -Patient endorses some noncompliance with diet at home  Hypertension  -Hold ARB due to AKI  -Blood pressure was creeping up, carvedilol increased  Hyperkalemia  -Mild--> improved  -Continue IV fluids  -Continue telemetry  -Continue to follow BMP   Anxiety  -Patient takes alprazolam when necessary home  -Restart low-dose alprazolam po, IV ativan if cannot take po  Right lower extremity pain and edema  -Venous ultrasound rule out DVT--negative  Constipation  -Try bisacodyl--patient has had small bowel movements  -Add Senokot to Colace  -Abdominal x-ray without obstruction or free air  Coronary artery disease with history of stent  -Cardiac catheterization 09/14/2010 with DES  -Finished one year of uninterrupted aspirin and Plavix  -Continue aspirin 325 daily  Family Communication: Husband at beside  Disposition Plan: Home when medically stable  Procedures:  -cystoscopy with b/l ureteral stents--08/13/12  Antibiotics:  Ceftriaxone 08/12/2012>>> 08/14/2012  Family Communication: husband at beside  Disposition Plan: Home when medically stable          Procedures/Studies: Ct Abdomen Pelvis W Contrast  08/12/2012  *RADIOLOGY REPORT*  Clinical Data: Right lower quadrant pain, nausea, vomiting.  CT ABDOMEN AND PELVIS WITH CONTRAST  Technique:  Multidetector CT imaging of the abdomen and pelvis was performed following the standard protocol during bolus administration of intravenous contrast.  Contrast: OMNIPAQUE IOHEXOL 300 MG/ML  SOLN  Comparison: None.  Findings: Heart is normal size.  Lung bases are clear.  No effusions.  Suspect mild fatty infiltration of the liver.  No focal abnormality.  Gallbladder, stomach, spleen, pancreas, adrenals are unremarkable.  There is an 8 mm stone within the proximal left ureter.  Mild fullness of the left renal collecting system.  3 mm distal right ureteral stone near the right UVJ.  Slight fullness of the right renal collecting system.  Small nonobstructing stone in the lower pole of the right kidney.  Appendix is visualized and is normal. Bowel grossly unremarkable. No free fluid, free air, or adenopathy.  Uterus, adnexa are unremarkable.  Urinary bladder decompressed.  There is a moderate  sized  umbilical hernia containing fat.  No acute bony abnormality.  Aortic and iliac calcifications.  No aneurysm.  IMPRESSION: 8 mm proximal left ureteral stone.  3 mm distal right ureteral stone.  Mild fullness of the renal collecting systems bilaterally.  Right nephrolithiasis.  Normal appendix.  Umbilical hernia containing fat.   Original Report Authenticated By: Charlett Nose, M.D.    Dg Abd Portable 2v  08/15/2012  *RADIOLOGY REPORT*  Clinical Data: Abdominal pain and constipation.  Abdominal distension.  PORTABLE ABDOMEN - 2 VIEW  Comparison: CT abdomen and pelvis 08/12/2012  Findings: Study is technically limited due to the patient's body habitus.  Interval placement of bilateral ureteral stents with proximal pigtails projected over the expected location of the kidneys and distal pigtails projected over the expected location of the bladder.  Renal stones demonstrated CT are not definitely shown radiographically. Scattered gas and stool in the colon.  No small or large bowel distension.  No free intra-abdominal air.  No abnormal air fluid levels.  IMPRESSION: Bilateral ureteral stents.  Nonobstructive bowel gas pattern.   Original Report Authenticated By: Burman Nieves, M.D.          Subjective: Patient continues to feel better. Denies fevers, chills, chest pain, shortness breath, nausea, vomiting, diarrhea. She is having bowel movements regularly now. Denies any dysuria. Abdominal pain is improving.  Objective: Filed Vitals:   08/16/12 0200 08/16/12 0451 08/16/12 0808 08/16/12 0953  BP: 121/69 154/67 123/50 139/51  Pulse: 55 58 61 64  Temp: 97.1 F (36.2 C) 97.6 F (36.4 C)  98.7 F (37.1 C)  TempSrc: Oral Oral  Oral  Resp: 20 18  20   Height:      Weight:      SpO2: 99% 99%  96%    Intake/Output Summary (Last 24 hours) at 08/16/12 1300 Last data filed at 08/16/12 0900  Gross per 24 hour  Intake 2864.5 ml  Output   1200 ml  Net 1664.5 ml   Weight change:   Exam:   General:  Pt is alert, follows commands appropriately, not in acute distress  HEENT: No icterus, No thrush, [place]/AT  Cardiovascular: RRR, S1/S2, no rubs, no gallops  Respiratory: CTA bilaterally, no wheezing, no crackles, no rhonchi  Abdomen: Soft/+BS, non tender, non distended, no guarding  Extremities: No edema, No lymphangitis, No petechiae, No rashes, no synovitis  Data Reviewed: Basic Metabolic Panel:  Recent Labs Lab 08/12/12 1705 08/13/12 0509 08/14/12 0244 08/15/12 0500 08/16/12 0415  NA 132* 138 137 135 137  K 5.4* 5.3* 4.6 5.0 4.9  CL 98 104 104 104 107  CO2 22 23 22 24 21   GLUCOSE 149* 127* 175* 228* 219*  BUN 38* 41* 35* 35* 35*  CREATININE 2.74* 3.11* 2.10* 1.71* 1.65*  CALCIUM 9.4 8.7 8.3* 8.4 8.6   Liver Function Tests: No results found for this basename: AST, ALT, ALKPHOS, BILITOT, PROT, ALBUMIN,  in the last 168 hours No results found for this basename: LIPASE, AMYLASE,  in the last 168 hours No results found for this basename: AMMONIA,  in the last 168 hours CBC:  Recent Labs Lab 08/12/12 1705 08/13/12 0509 08/14/12 0244 08/15/12 0500 08/16/12 0415  WBC 10.5 8.1 8.4 7.1 5.7  NEUTROABS 8.4*  --   --   --   --   HGB 11.8* 10.1* 9.6* 8.9* 8.6*  HCT 34.4* 32.1* 29.9* 27.9* 26.8*  MCV 86.2 88.7 89.5 90.0 89.6  PLT 204 201 190 183 177   Cardiac Enzymes:  Recent Labs Lab 08/13/12 2200 08/14/12 0244 08/14/12 0830  TROPONINI <0.30 1.89* 4.36*   BNP: No components found with this basename: POCBNP,  CBG:  Recent Labs Lab 08/15/12 0704 08/15/12 1151 08/15/12 1643 08/15/12 2032 08/16/12 0719  GLUCAP 199* 265* 198* 249* 200*    Recent Results (from the past 240 hour(s))  URINE CULTURE     Status: None   Collection Time    08/12/12  5:30 PM      Result Value Range Status   Specimen Description URINE, CATHETERIZED   Final   Special Requests NONE   Final   Culture  Setup Time 08/12/2012 20:06   Final   Colony Count NO  GROWTH   Final   Culture NO GROWTH   Final   Report Status 08/14/2012 FINAL   Final     Scheduled Meds: . aspirin EC  325 mg Oral Daily  . carvedilol  25 mg Oral BID WC  . docusate sodium  100 mg Oral Q12H  . enoxaparin (LOVENOX) injection  55 mg Subcutaneous Daily  . insulin aspart  0-15 Units Subcutaneous TID WC  . insulin aspart  0-5 Units Subcutaneous QHS  . insulin aspart  2 Units Subcutaneous TID WC  . insulin glargine  8 Units Subcutaneous QHS  . nitroGLYCERIN  0.3 mg Transdermal Daily  . polyethylene glycol  17 g Oral BID  . rosuvastatin  10 mg Oral QHS  . senna  2 tablet Oral Daily   Continuous Infusions: . sodium chloride 100 mL/hr at 08/16/12 0455     Latissa Frick, DO  [facility] Pager [phone]  If 7PM-7AM, please contact night-coverage [email or web address] Password [email or web address] 08/16/2012, 1:00 PM   LOS: 4 days

## 2012-08-17 DIAGNOSIS — E875 Hyperkalemia: Secondary | ICD-10-CM

## 2012-08-17 DIAGNOSIS — D509 Iron deficiency anemia, unspecified: Secondary | ICD-10-CM

## 2012-08-17 LAB — GLUCOSE, CAPILLARY
Glucose-Capillary: 255 mg/dL — ABNORMAL HIGH (ref 70–99)
Glucose-Capillary: 200 mg/dL — ABNORMAL HIGH (ref 70–99)
Glucose-Capillary: 276 mg/dL — ABNORMAL HIGH (ref 70–99)
Glucose-Capillary: 311 mg/dL — ABNORMAL HIGH (ref 70–99)

## 2012-08-17 LAB — BASIC METABOLIC PANEL
BUN: 30 mg/dL — ABNORMAL HIGH (ref 6–23)
CO2: 24 mEq/L (ref 19–32)
Chloride: 107 mEq/L (ref 96–112)
GFR calc non Af Amer: 42 mL/min — ABNORMAL LOW (ref >90)
Glucose, Bld: 249 mg/dL — ABNORMAL HIGH (ref 70–99)
Potassium: 5 mEq/L (ref 3.5–5.1)
Sodium: 136 mEq/L (ref 135–145)

## 2012-08-17 LAB — CBC
HCT: 25.8 % — ABNORMAL LOW (ref 36.0–46.0)
Hemoglobin: 8.2 g/dL — ABNORMAL LOW (ref 12.0–15.0)
MCHC: 31.8 g/dL (ref 30.0–36.0)
RBC: 2.87 MIL/uL — ABNORMAL LOW (ref 3.87–5.11)
WBC: 5.4 10*3/uL (ref 4.0–10.5)

## 2012-08-17 LAB — PRO B NATRIURETIC PEPTIDE: Pro B Natriuretic peptide (BNP): 1189 pg/mL — ABNORMAL HIGH (ref 0–125)

## 2012-08-17 MED ORDER — SODIUM CHLORIDE 0.9 % IV SOLN
INTRAVENOUS | Status: DC
  Administered 2012-08-17 – 2012-08-18 (×2): via INTRAVENOUS

## 2012-08-17 MED ORDER — INSULIN ASPART 100 UNIT/ML ~~LOC~~ SOLN
4.0000 [IU] | Freq: Three times a day (TID) | SUBCUTANEOUS | Status: DC
  Administered 2012-08-17 – 2012-08-18 (×3): 4 [IU] via SUBCUTANEOUS

## 2012-08-17 MED ORDER — INSULIN GLARGINE 100 UNIT/ML ~~LOC~~ SOLN
12.0000 [IU] | Freq: Every day | SUBCUTANEOUS | Status: DC
  Administered 2012-08-17: 12 [IU] via SUBCUTANEOUS
  Filled 2012-08-17 (×2): qty 0.12

## 2012-08-17 MED ORDER — FERROUS SULFATE 325 (65 FE) MG PO TABS
325.0000 mg | ORAL_TABLET | Freq: Two times a day (BID) | ORAL | Status: DC
  Administered 2012-08-17: 325 mg via ORAL
  Filled 2012-08-17 (×4): qty 1

## 2012-08-17 MED ORDER — FERUMOXYTOL INJECTION 510 MG/17 ML
510.0000 mg | Freq: Once | INTRAVENOUS | Status: AC
  Administered 2012-08-17: 510 mg via INTRAVENOUS
  Filled 2012-08-17: qty 17

## 2012-08-17 NOTE — Progress Notes (Signed)
[facility] PROGRESS NOTE  Kimberly Hardin WUJ:[id number] DOB: 09/10/1950 DOA: 08/12/2012 PCP: Ezequiel Kayser, MD  Assessment/Plan: Nephrolithiasis with obstructive uropathy  -S/P CYSTOSCOPY WITH RETROGRADE PYELOGRAM/URETERAL STENT PLACEMENT (Bilateral) on 08/13/12  -Appreciate Dr. Berneice Heinrich  -Definitive stone management to be set up as outpatient  Acute kidney injury  -continues to improve with IVF  -Decrease rate of IV fluids as patient's oral intake has improved  -I/O is +1018cc in past 24hrs  -Likely multifactorial including dehydration (vomiting), obstruction, and meds (NSAID, ARB)  -Patient took approximately 5 doses of naproxen 500 mg and Saturday and date of admission  -Baseline creatinine 0.6-0.8  -08/12/2012 CT abdomen shows 8 mm left ureteral stone, 3 mm right ureteral stone  Acute coronary syndrome/NSTEMI  -Night of 08/13/2012 Patient had chest discomfort accompanied by abnormal EKG and elevated troponins  -Cardiology is following  -Continue carvedilol 25mg  bid -Continue longacting nitrate -Heparin drip discontinued (08/15/2012)  -Patient is currently pain-free  -Cardiac catheterization pending improving renal function  -LDL 104  -Patient started on Crestor--tolerating well  Iron deficiency -Iron saturation 11%, ferritin 132 -give dose of Feraheme and start ferrous sulfate -Baseline hemoglobin 11-12  -Likely dilutional as all cell lines have decreased  -Remained hemodynamically stable  Pyuria  -Likely due to nephrolithiasis  -Followup urine culture--> negative  -Discontinue ceftriaxone  Diabetes mellitus type 2  -Discontinue metformin  -NovoLog sliding scale  -Increase NovoLog 4 units pre-meal  -Increase Lantus 12 units as by mouth intake has gradually increased  -hemoglobin A1c--8.8  -Patient endorses some noncompliance with diet at home  Hypertension  -Hold ARB due to AKI  -Blood pressure was creeping up, carvedilol increased  Hyperkalemia  -Mild-->  improved  -Continue IV fluids  -Continue telemetry  -Continue to follow BMP  Anxiety  -Patient takes alprazolam when necessary home  -Restart low-dose alprazolam po, IV ativan if cannot take po  Right lower extremity pain and edema  -Venous ultrasound rule out DVT--negative  Constipation  -Try bisacodyl--patient has had small bowel movements  -Add Senokot to Colace  -Abdominal x-ray without obstruction or free air  Coronary artery disease with history of stent  -Cardiac catheterization 09/14/2010 with DES  -Finished one year of uninterrupted aspirin and Plavix  -Continue aspirin 325 daily  Family Communication: Husband at beside  Disposition Plan: Home when medically stable  Procedures:  -cystoscopy with b/l ureteral stents--08/13/12  Antibiotics:  Ceftriaxone 08/12/2012>>> 08/14/2012  Family Communication: husband at beside  Disposition Plan: Home when cleared by cardiology             Procedures/Studies: Ct Abdomen Pelvis W Contrast  08/12/2012  *RADIOLOGY REPORT*  Clinical Data: Right lower quadrant pain, nausea, vomiting.  CT ABDOMEN AND PELVIS WITH CONTRAST  Technique:  Multidetector CT imaging of the abdomen and pelvis was performed following the standard protocol during bolus administration of intravenous contrast.  Contrast: OMNIPAQUE IOHEXOL 300 MG/ML  SOLN  Comparison: None.  Findings: Heart is normal size.  Lung bases are clear.  No effusions.  Suspect mild fatty infiltration of the liver.  No focal abnormality.  Gallbladder, stomach, spleen, pancreas, adrenals are unremarkable.  There is an 8 mm stone within the proximal left ureter.  Mild fullness of the left renal collecting system.  3 mm distal right ureteral stone near the right UVJ.  Slight fullness of the right renal collecting system.  Small nonobstructing stone in the lower pole of the right kidney.  Appendix is visualized and is normal. Bowel grossly unremarkable. No free fluid,  free air, or  adenopathy.  Uterus, adnexa are unremarkable.  Urinary bladder decompressed.  There is a moderate sized umbilical hernia containing fat.  No acute bony abnormality.  Aortic and iliac calcifications.  No aneurysm.  IMPRESSION: 8 mm proximal left ureteral stone.  3 mm distal right ureteral stone.  Mild fullness of the renal collecting systems bilaterally.  Right nephrolithiasis.  Normal appendix.  Umbilical hernia containing fat.   Original Report Authenticated By: Charlett Nose, M.D.    Dg Abd Portable 2v  08/15/2012  *RADIOLOGY REPORT*  Clinical Data: Abdominal pain and constipation.  Abdominal distension.  PORTABLE ABDOMEN - 2 VIEW  Comparison: CT abdomen and pelvis 08/12/2012  Findings: Study is technically limited due to the patient's body habitus.  Interval placement of bilateral ureteral stents with proximal pigtails projected over the expected location of the kidneys and distal pigtails projected over the expected location of the bladder.  Renal stones demonstrated CT are not definitely shown radiographically. Scattered gas and stool in the colon.  No small or large bowel distension.  No free intra-abdominal air.  No abnormal air fluid levels.  IMPRESSION: Bilateral ureteral stents.  Nonobstructive bowel gas pattern.   Original Report Authenticated By: Burman Nieves, M.D.          Subjective: Patient states she had 8 bowel movements. Denies any fevers, chills, chest pain, shortness breath, nausea, vomiting, diarrhea, dysuria. Has intermittent abdominal cramping.  Objective: Filed Vitals:   08/17/12 0139 08/17/12 0244 08/17/12 0654 08/17/12 1306  BP: 172/63 139/62 168/80 128/49  Pulse: 58  56 57  Temp: 98.6 F (37 C)  98.2 F (36.8 C) 98.7 F (37.1 C)  TempSrc: Oral  Oral Oral  Resp: 20  20 16   Height:      Weight:      SpO2: 99%  99% 98%    Intake/Output Summary (Last 24 hours) at 08/17/12 1439 Last data filed at 08/17/12 1300  Gross per 24 hour  Intake 2898.33 ml  Output    2200 ml  Net 698.33 ml   Weight change:  Exam:   General:  Pt is alert, follows commands appropriately, not in acute distress  HEENT: No icterus, No thrush, [place]/AT  Cardiovascular: RRR, S1/S2, no rubs, no gallops  Respiratory: CTA bilaterally, no wheezing, no crackles, no rhonchi  Abdomen: Soft/+BS, mild tender@ left lower quadrant, right lower quadrant;  non distended, no guarding  Extremities: 1+ edema, No lymphangitis, No petechiae, No rashes, no synovitis  Data Reviewed: Basic Metabolic Panel:  Recent Labs Lab 08/13/12 0509 08/14/12 0244 08/15/12 0500 08/16/12 0415 08/17/12 0441  NA 138 137 135 137 136  K 5.3* 4.6 5.0 4.9 5.0  CL 104 104 104 107 107  CO2 23 22 24 21 24   GLUCOSE 127* 175* 228* 219* 249*  BUN 41* 35* 35* 35* 30*  CREATININE 3.11* 2.10* 1.71* 1.65* 1.35*  CALCIUM 8.7 8.3* 8.4 8.6 8.6   Liver Function Tests: No results found for this basename: AST, ALT, ALKPHOS, BILITOT, PROT, ALBUMIN,  in the last 168 hours No results found for this basename: LIPASE, AMYLASE,  in the last 168 hours No results found for this basename: AMMONIA,  in the last 168 hours CBC:  Recent Labs Lab 08/12/12 1705 08/13/12 0509 08/14/12 0244 08/15/12 0500 08/16/12 0415 08/17/12 0441  WBC 10.5 8.1 8.4 7.1 5.7 5.4  NEUTROABS 8.4*  --   --   --   --   --   HGB 11.8* 10.1* 9.6* 8.9*  8.6* 8.2*  HCT 34.4* 32.1* 29.9* 27.9* 26.8* 25.8*  MCV 86.2 88.7 89.5 90.0 89.6 89.9  PLT 204 201 190 183 177 188   Cardiac Enzymes:  Recent Labs Lab 08/13/12 2200 08/14/12 0244 08/14/12 0830  TROPONINI <0.30 1.89* 4.36*   BNP: No components found with this basename: POCBNP,  CBG:  Recent Labs Lab 08/16/12 0719 08/16/12 1153 08/16/12 1720 08/16/12 2205 08/17/12 0801  GLUCAP 200* 277* 275* 296* 255*    Recent Results (from the past 240 hour(s))  URINE CULTURE     Status: None   Collection Time    08/12/12  5:30 PM      Result Value Range Status   Specimen Description  URINE, CATHETERIZED   Final   Special Requests NONE   Final   Culture  Setup Time 08/12/2012 20:06   Final   Colony Count NO GROWTH   Final   Culture NO GROWTH   Final   Report Status 08/14/2012 FINAL   Final     Scheduled Meds: . aspirin EC  325 mg Oral Daily  . carvedilol  25 mg Oral BID WC  . docusate sodium  100 mg Oral Q12H  . enoxaparin (LOVENOX) injection  55 mg Subcutaneous Daily  . ferrous sulfate  325 mg Oral BID WC  . ferumoxytol  510 mg Intravenous Once  . insulin aspart  0-15 Units Subcutaneous TID WC  . insulin aspart  0-5 Units Subcutaneous QHS  . insulin aspart  4 Units Subcutaneous TID WC  . insulin glargine  12 Units Subcutaneous QHS  . nitroGLYCERIN  0.3 mg Transdermal Daily  . polyethylene glycol  17 g Oral BID  . rosuvastatin  10 mg Oral QHS  . senna  2 tablet Oral Daily   Continuous Infusions:    Alysabeth Scalia, DO  [facility] Pager [phone]  If 7PM-7AM, please contact night-coverage [email or web address] Password [email or web address] 08/17/2012, 2:39 PM   LOS: 5 days

## 2012-08-17 NOTE — Progress Notes (Signed)
SUBJECTIVE:  No chest pain but still has some mild abdominal pain  OBJECTIVE:   Vitals:   Filed Vitals:   08/16/12 2200 08/17/12 0139 08/17/12 0244 08/17/12 0654  BP: 141/64 172/63 139/62 168/80  Pulse: 60 58  56  Temp: 98.6 F (37 C) 98.6 F (37 C)  98.2 F (36.8 C)  TempSrc: Oral Oral  Oral  Resp: 20 20  20   Height:      Weight:      SpO2: 98% 99%  99%   I&O's:   Intake/Output Summary (Last 24 hours) at 08/17/12 0718 Last data filed at 08/17/12 4098  Gross per 24 hour  Intake 3618.33 ml  Output   2600 ml  Net 1018.33 ml   TELEMETRY: Reviewed telemetry pt in NSR/sinus bradycardia     PHYSICAL EXAM General: Well developed, well nourished, in no acute distress Head: Eyes PERRLA, No xanthomas.   Normal cephalic and atramatic  Lungs:   Clear bilaterally to auscultation and percussion. Heart:   HRRR S1 S2 Pulses are 2+ & equal. Abdomen: Bowel sounds are positive, abdomen soft and non-tender without masses  Extremities:  Trace edema Neuro: Alert and oriented X 3. Psych:  Good affect, responds appropriately   LABS: Basic Metabolic Panel:  Recent Labs  11/91/47 0415 08/17/12 0441  NA 137 136  K 4.9 5.0  CL 107 107  CO2 21 24  GLUCOSE 219* 249*  BUN 35* 30*  CREATININE 1.65* 1.35*  CALCIUM 8.6 8.6   Liver Function Tests: No results found for this basename: AST, ALT, ALKPHOS, BILITOT, PROT, ALBUMIN,  in the last 72 hours No results found for this basename: LIPASE, AMYLASE,  in the last 72 hours CBC:  Recent Labs  08/16/12 0415 08/17/12 0441  WBC 5.7 5.4  HGB 8.6* 8.2*  HCT 26.8* 25.8*  MCV 89.6 89.9  PLT 177 188   Cardiac Enzymes:  Recent Labs  08/14/12 0830  TROPONINI 4.36*   BNP: No components found with this basename: POCBNP,  D-Dimer: No results found for this basename: DDIMER,  in the last 72 hours Hemoglobin A1C:  Recent Labs  08/15/12 0500  HGBA1C 8.8*   Fasting Lipid Panel: No results found for this basename: CHOL, HDL,  LDLCALC, TRIG, CHOLHDL, LDLDIRECT,  in the last 72 hours Thyroid Function Tests: No results found for this basename: TSH, T4TOTAL, FREET3, T3FREE, THYROIDAB,  in the last 72 hours Anemia Panel:  Recent Labs  08/16/12 1355  FERRITIN 132  TIBC 270  IRON 29*   Coag Panel:   Lab Results  Component Value Date   INR 0.89 04/02/2010   INR 0.88 12/07/2009    RADIOLOGY: Ct Abdomen Pelvis W Contrast  08/12/2012  *RADIOLOGY REPORT*  Clinical Data: Right lower quadrant pain, nausea, vomiting.  CT ABDOMEN AND PELVIS WITH CONTRAST  Technique:  Multidetector CT imaging of the abdomen and pelvis was performed following the standard protocol during bolus administration of intravenous contrast.  Contrast: OMNIPAQUE IOHEXOL 300 MG/ML  SOLN  Comparison: None.  Findings: Heart is normal size.  Lung bases are clear.  No effusions.  Suspect mild fatty infiltration of the liver.  No focal abnormality.  Gallbladder, stomach, spleen, pancreas, adrenals are unremarkable.  There is an 8 mm stone within the proximal left ureter.  Mild fullness of the left renal collecting system.  3 mm distal right ureteral stone near the right UVJ.  Slight fullness of the right renal collecting system.  Small nonobstructing stone in the  lower pole of the right kidney.  Appendix is visualized and is normal. Bowel grossly unremarkable. No free fluid, free air, or adenopathy.  Uterus, adnexa are unremarkable.  Urinary bladder decompressed.  There is a moderate sized umbilical hernia containing fat.  No acute bony abnormality.  Aortic and iliac calcifications.  No aneurysm.  IMPRESSION: 8 mm proximal left ureteral stone.  3 mm distal right ureteral stone.  Mild fullness of the renal collecting systems bilaterally.  Right nephrolithiasis.  Normal appendix.  Umbilical hernia containing fat.   Original Report Authenticated By: Charlett Nose, M.D.    Dg Abd Portable 2v  08/15/2012  *RADIOLOGY REPORT*  Clinical Data: Abdominal pain and  constipation.  Abdominal distension.  PORTABLE ABDOMEN - 2 VIEW  Comparison: CT abdomen and pelvis 08/12/2012  Findings: Study is technically limited due to the patient's body habitus.  Interval placement of bilateral ureteral stents with proximal pigtails projected over the expected location of the kidneys and distal pigtails projected over the expected location of the bladder.  Renal stones demonstrated CT are not definitely shown radiographically. Scattered gas and stool in the colon.  No small or large bowel distension.  No free intra-abdominal air.  No abnormal air fluid levels.  IMPRESSION: Bilateral ureteral stents.  Nonobstructive bowel gas pattern.   Original Report Authenticated By: Burman Nieves, M.D.     ASSESSMENT:  1. Non-ST elevation myocardial infarction likely due to restenosis of the LAD stent. 2. Continued abdominal discomfort and hematuria but improved, due to ureteral calculi  3. Acute kidney injury, improving  4. Acute blood loss anemia - Hbg trending downward Plan:  1. Continue beta blocker, long-acting nitrate, and aspirin.  3. Continue to monitor renal function and avoid contrast if possible for the next 5-7 days.  4.  Consider transfusion since hemoglobin continues to drop and has now dropped to  less than 8.5. She will need IV Lasix to prevent heart failure. If transfused, she will probably need to move to the unit.        Quintella Reichert, MD  08/17/2012  7:18 AM

## 2012-08-18 DIAGNOSIS — D509 Iron deficiency anemia, unspecified: Secondary | ICD-10-CM

## 2012-08-18 LAB — FOLATE RBC: RBC Folate: 765 ng/mL — ABNORMAL HIGH (ref >=366)

## 2012-08-18 LAB — BASIC METABOLIC PANEL
Calcium: 8.8 mg/dL (ref 8.4–10.5)
GFR calc non Af Amer: 44 mL/min — ABNORMAL LOW (ref >90)
Potassium: 5 mEq/L (ref 3.5–5.1)
Sodium: 140 mEq/L (ref 135–145)

## 2012-08-18 LAB — CBC
MCH: 28.8 pg (ref 26.0–34.0)
MCHC: 31.7 g/dL (ref 30.0–36.0)
Platelets: 206 10*3/uL (ref 150–400)
RBC: 2.99 MIL/uL — ABNORMAL LOW (ref 3.87–5.11)

## 2012-08-18 LAB — GLUCOSE, CAPILLARY

## 2012-08-18 MED ORDER — OXYCODONE HCL 5 MG PO TABS
10.0000 mg | ORAL_TABLET | ORAL | Status: DC | PRN
  Administered 2012-08-18 – 2012-08-19 (×3): 10 mg via ORAL
  Filled 2012-08-18 (×3): qty 2

## 2012-08-18 MED ORDER — INSULIN ASPART 100 UNIT/ML ~~LOC~~ SOLN
5.0000 [IU] | Freq: Three times a day (TID) | SUBCUTANEOUS | Status: DC
  Administered 2012-08-18 – 2012-08-19 (×3): 5 [IU] via SUBCUTANEOUS

## 2012-08-18 MED ORDER — INSULIN ASPART 100 UNIT/ML ~~LOC~~ SOLN: 0.0000 [IU] | SUBCUTANEOUS | Status: DC

## 2012-08-18 MED ORDER — POLYSACCHARIDE IRON COMPLEX 150 MG PO CAPS
150.0000 mg | ORAL_CAPSULE | Freq: Every day | ORAL | Status: DC
  Administered 2012-08-18 – 2012-08-19 (×2): 150 mg via ORAL
  Filled 2012-08-18 (×2): qty 1

## 2012-08-18 MED ORDER — INSULIN GLARGINE 100 UNIT/ML ~~LOC~~ SOLN
18.0000 [IU] | Freq: Every day | SUBCUTANEOUS | Status: DC
  Administered 2012-08-18: 18 [IU] via SUBCUTANEOUS
  Filled 2012-08-18 (×2): qty 0.18

## 2012-08-18 NOTE — Progress Notes (Signed)
Patient Name: Kimberly Hardin Date of Encounter: 08/18/2012    SUBJECTIVE: In bathroom this AM and could not come out during the visit. Still with intermittent abdominal pain.No blood in urine.  TELEMETRY:  NSR: Filed Vitals:   08/17/12 1306 08/17/12 2115 08/18/12 0206 08/18/12 0539  BP: 128/49 151/61 135/55 155/70  Pulse: 57 58 55 56  Temp: 98.7 F (37.1 C) 98.3 F (36.8 C) 98.2 F (36.8 C) 98.5 F (36.9 C)  TempSrc: Oral Oral Oral Oral  Resp: 16 16 18 18   Height:      Weight:      SpO2: 98% 90% 98% 95%    Intake/Output Summary (Last 24 hours) at 08/18/12 0821 Last data filed at 08/18/12 1478  Gross per 24 hour  Intake 1589.17 ml  Output   2200 ml  Net -610.83 ml    LABS: Basic Metabolic Panel:  Recent Labs  29/56/21 0441 08/18/12 0423  NA 136 140  K 5.0 5.0  CL 107 110  CO2 24 24  GLUCOSE 249* 244*  BUN 30* 29*  CREATININE 1.35* 1.29*  CALCIUM 8.6 8.8   CBC:  Recent Labs  08/17/12 0441 08/18/12 0423  WBC 5.4 6.1  HGB 8.2* 8.6*  HCT 25.8* 27.1*  MCV 89.9 90.6  PLT 188 206    Radiology/Studies:  No new.  Physical Exam: Blood pressure 155/70, pulse 56, temperature 98.5 F (36.9 C), temperature source Oral, resp. rate 18, height 4\' 11"  (1.499 m), weight 112.537 kg (248 lb 1.6 oz), SpO2 95.00%. Weight change:    Not examined.  ASSESSMENT:  1. NSTEMI, rendered asymptomatic on meds but has 15 % recurrent cardiac event rate over the next 4-6 months. High risk for anesthesia and surgery of any sort. 2. Severe anemia.... 3. Bilateral ureteral stones 4. Acute kidney injury gradually improved since admission  Plan:  1. Need as much time as possible before doing any cardiac procedures due to contrast and possible recurrent kidney injury and the possibility of PCI requiring anticoagulation and DAPT therapy long-term. 2. What is planned from urologic and medical viewpoint?   Caryl Pina Rosina Lowenstein 08/18/2012, 8:21 AM

## 2012-08-18 NOTE — Progress Notes (Signed)
Inpatient Diabetes Program Recommendations  AACE/ADA: New Consensus Statement on Inpatient Glycemic Control (2013)  Target Ranges:  Prepandial:   less than 140 mg/dL      Peak postprandial:   less than 180 mg/dL (1-2 hours)      Critically ill patients:  140 - 180 mg/dL   Reason for Assessment: Continued sub-optimal glycemic control  Note:  Results for MARKIE, HEFFERNAN (MRN [id number]) as of 08/18/2012 14:02  Ref. Range 08/17/2012 08:01 08/17/2012 11:28 08/17/2012 16:23 08/17/2012 21:50 08/18/2012 07:24 08/18/2012 12:01  Glucose-Capillary Latest Range: 70-99 mg/dL 846 (H) 962 (H) 952 (H) 311 (H) 215 (H) 193 (H)   Hgb A1c is 8.8 indicating sub-optimal glycemic control prior to admission.  Left note for treatment team to encourage patient to follow-up with the Diabetes Educator in Dr. Laurey Morale office after discharge when able in an effort to improve self-care.  Also requested nursing staff review diabetes self-care as able given patient's acute illness.  Eating 95 - 100% of meals.  Lantus dosage increased from 8 units to 12 units last night.  If CBG above 180 mg/dl tomorrow, please increase Lantus dose again-- to closer to home dose.  Meal coverage increased at supper yesterday to 4 units tid with meals.  Please consider increasing meal coverage to at least 6 units tid with meals today.  Thank you. Tammera Engert S. Elsie Lincoln, RN, CNS, CDE Inpatient Diabetes Program, team pager [phone]

## 2012-08-18 NOTE — Progress Notes (Signed)
[facility] PROGRESS NOTE  Kimberly Hardin ZOX:[id number] DOB: 1950-06-15 DOA: 08/12/2012 PCP: Ezequiel Kayser, MD  Assessment/Plan: Nephrolithiasis with obstructive uropathy  -S/P CYSTOSCOPY WITH RETROGRADE PYELOGRAM/URETERAL STENT PLACEMENT (Bilateral) on 08/13/12  -Appreciate Dr. Berneice Heinrich  -Discussed w/ Dr. Berneice Heinrich today--stents can remain indefinitely and No uteroscopic stone intervention is needed at this time -d/c foley catheter -stable for d/c per urology Acute kidney injury  -continues to improve with IVF  -Decrease rate of IV fluids as patient's oral intake has improved  -I/O is -370cc in past 24hrs  -Likely multifactorial including dehydration (vomiting), obstruction, and meds (NSAID, ARB)  -Patient took approximately 5 doses of naproxen 500 mg and Saturday and date of admission  -Baseline creatinine 0.6-0.8  -08/12/2012 CT abdomen shows 8 mm left ureteral stone, 3 mm right ureteral stone  Acute coronary syndrome/NSTEMI  -Night of 08/13/2012 Patient had chest discomfort accompanied by abnormal EKG and elevated troponins  -Cardiology is following  -Continue carvedilol 25mg  bid  -Continue longacting nitrate  -Heparin drip discontinued (08/15/2012)  -Patient is currently pain-free  -Cardiac catheterization pending improving renal function -Dual antiplatelet therapy okay with current stents  -LDL 104  -Patient started on Crestor--tolerating well  Iron deficiency  -Iron saturation 11%, ferritin 132  -given a  dose of Feraheme -Patient has intolerances to ferrous sulfate, try Niferex -Baseline hemoglobin 11-12  -Likely dilutional as all cell lines have decreased  -Remained hemodynamically stable  Pyuria  -Likely due to nephrolithiasis  -Followup urine culture--> negative  -Discontinue ceftriaxone  Diabetes mellitus type 2  -Discontinue metformin  -NovoLog sliding scale  -Increase NovoLog 5 units pre-meal  -Increase Lantus 18 units as by mouth intake has gradually  increased  -hemoglobin A1c--8.8  -Patient endorses some noncompliance with diet at home  Hypertension  -Hold ARB due to AKI  -Blood pressure was creeping up, carvedilol increased  Hyperkalemia  -Mild--> improved  -Continue IV fluids  -Continue telemetry  -Continue to follow BMP  Anxiety  -Patient takes alprazolam when necessary home  -Restart low-dose alprazolam po, IV ativan if cannot take po  Right lower extremity pain and edema  -Venous ultrasound rule out DVT--negative  Constipation  -Try bisacodyl--patient has had small bowel movements  -Add Senokot to Colace  -Abdominal x-ray without obstruction or free air  Coronary artery disease with history of stent  -Cardiac catheterization 09/14/2010 with DES  -Finished one year of uninterrupted aspirin and Plavix  -Continue aspirin 325 daily  Family Communication: Husband at beside  Disposition Plan:  Home 08/19/12 if OK with cardiology Procedures:  -cystoscopy with b/l ureteral stents--08/13/12  Antibiotics:  Ceftriaxone 08/12/2012>>> 08/14/2012  Family Communication: husband at beside             Procedures/Studies: Ct Abdomen Pelvis W Contrast  08/12/2012  *RADIOLOGY REPORT*  Clinical Data: Right lower quadrant pain, nausea, vomiting.  CT ABDOMEN AND PELVIS WITH CONTRAST  Technique:  Multidetector CT imaging of the abdomen and pelvis was performed following the standard protocol during bolus administration of intravenous contrast.  Contrast: OMNIPAQUE IOHEXOL 300 MG/ML  SOLN  Comparison: None.  Findings: Heart is normal size.  Lung bases are clear.  No effusions.  Suspect mild fatty infiltration of the liver.  No focal abnormality.  Gallbladder, stomach, spleen, pancreas, adrenals are unremarkable.  There is an 8 mm stone within the proximal left ureter.  Mild fullness of the left renal collecting system.  3 mm distal right ureteral stone near the right UVJ.  Slight fullness of the right renal  collecting system.  Small  nonobstructing stone in the lower pole of the right kidney.  Appendix is visualized and is normal. Bowel grossly unremarkable. No free fluid, free air, or adenopathy.  Uterus, adnexa are unremarkable.  Urinary bladder decompressed.  There is a moderate sized umbilical hernia containing fat.  No acute bony abnormality.  Aortic and iliac calcifications.  No aneurysm.  IMPRESSION: 8 mm proximal left ureteral stone.  3 mm distal right ureteral stone.  Mild fullness of the renal collecting systems bilaterally.  Right nephrolithiasis.  Normal appendix.  Umbilical hernia containing fat.   Original Report Authenticated By: Charlett Nose, M.D.    Dg Abd Portable 2v  08/15/2012  *RADIOLOGY REPORT*  Clinical Data: Abdominal pain and constipation.  Abdominal distension.  PORTABLE ABDOMEN - 2 VIEW  Comparison: CT abdomen and pelvis 08/12/2012  Findings: Study is technically limited due to the patient's body habitus.  Interval placement of bilateral ureteral stents with proximal pigtails projected over the expected location of the kidneys and distal pigtails projected over the expected location of the bladder.  Renal stones demonstrated CT are not definitely shown radiographically. Scattered gas and stool in the colon.  No small or large bowel distension.  No free intra-abdominal air.  No abnormal air fluid levels.  IMPRESSION: Bilateral ureteral stents.  Nonobstructive bowel gas pattern.   Original Report Authenticated By: Burman Nieves, M.D.          Subjective: Patient still has intermittent abdominal pain. Having bowel movements. Denies fevers, chills, chest pain, shortness breath, nausea, vomiting, diarrhea, dysuria.  Objective: Filed Vitals:   08/17/12 2115 08/18/12 0206 08/18/12 0539 08/18/12 0915  BP: 151/61 135/55 155/70 153/54  Pulse: 58 55 56 62  Temp: 98.3 F (36.8 C) 98.2 F (36.8 C) 98.5 F (36.9 C)   TempSrc: Oral Oral Oral   Resp: 16 18 18    Height:      Weight:      SpO2: 90% 98% 95%      Intake/Output Summary (Last 24 hours) at 08/18/12 1335 Last data filed at 08/18/12 1100  Gross per 24 hour  Intake 1829.17 ml  Output   2200 ml  Net -370.83 ml   Weight change:  Exam:   General:  Pt is alert, follows commands appropriately, not in acute distress  HEENT: No icterus, No thrush, No neck mass, [place]/AT  Cardiovascular: RRR, S1/S2, no rubs, no gallops  Respiratory: CTA bilaterally, no wheezing, no crackles, no rhonchi  Abdomen: Soft/+BS, non tender, non distended, no guarding  Extremities: No edema, No lymphangitis, No petechiae, No rashes, no synovitis  Data Reviewed: Basic Metabolic Panel:  Recent Labs Lab 08/14/12 0244 08/15/12 0500 08/16/12 0415 08/17/12 0441 08/18/12 0423  NA 137 135 137 136 140  K 4.6 5.0 4.9 5.0 5.0  CL 104 104 107 107 110  CO2 22 24 21 24 24   GLUCOSE 175* 228* 219* 249* 244*  BUN 35* 35* 35* 30* 29*  CREATININE 2.10* 1.71* 1.65* 1.35* 1.29*  CALCIUM 8.3* 8.4 8.6 8.6 8.8   Liver Function Tests: No results found for this basename: AST, ALT, ALKPHOS, BILITOT, PROT, ALBUMIN,  in the last 168 hours No results found for this basename: LIPASE, AMYLASE,  in the last 168 hours No results found for this basename: AMMONIA,  in the last 168 hours CBC:  Recent Labs Lab 08/12/12 1705  08/14/12 0244 08/15/12 0500 08/16/12 0415 08/17/12 0441 08/18/12 0423  WBC 10.5  < > 8.4 7.1 5.7 5.4 6.1  NEUTROABS 8.4*  --   --   --   --   --   --   HGB 11.8*  < > 9.6* 8.9* 8.6* 8.2* 8.6*  HCT 34.4*  < > 29.9* 27.9* 26.8* 25.8* 27.1*  MCV 86.2  < > 89.5 90.0 89.6 89.9 90.6  PLT 204  < > 190 183 177 188 206  < > = values in this interval not displayed. Cardiac Enzymes:  Recent Labs Lab 08/13/12 2200 08/14/12 0244 08/14/12 0830  TROPONINI <0.30 1.89* 4.36*   BNP: No components found with this basename: POCBNP,  CBG:  Recent Labs Lab 08/17/12 1128 08/17/12 1623 08/17/12 2150 08/18/12 0724 08/18/12 1201  GLUCAP 200* 276* 311*  215* 193*    Recent Results (from the past 240 hour(s))  URINE CULTURE     Status: None   Collection Time    08/12/12  5:30 PM      Result Value Range Status   Specimen Description URINE, CATHETERIZED   Final   Special Requests NONE   Final   Culture  Setup Time 08/12/2012 20:06   Final   Colony Count NO GROWTH   Final   Culture NO GROWTH   Final   Report Status 08/14/2012 FINAL   Final     Scheduled Meds: . aspirin EC  325 mg Oral Daily  . carvedilol  25 mg Oral BID WC  . docusate sodium  100 mg Oral Q12H  . enoxaparin (LOVENOX) injection  55 mg Subcutaneous Daily  . ferrous sulfate  325 mg Oral BID WC  . insulin aspart  0-15 Units Subcutaneous TID WC  . insulin aspart  0-5 Units Subcutaneous QHS  . insulin aspart  4 Units Subcutaneous TID WC  . insulin glargine  12 Units Subcutaneous QHS  . nitroGLYCERIN  0.3 mg Transdermal Daily  . polyethylene glycol  17 g Oral BID  . rosuvastatin  10 mg Oral QHS  . senna  2 tablet Oral Daily   Continuous Infusions: . sodium chloride 50 mL/hr at 08/18/12 0627     Krystol Rocco, DO  [facility] Pager [phone]  If 7PM-7AM, please contact night-coverage [email or web address] Password [email or web address] 08/18/2012, 1:35 PM   LOS: 6 days

## 2012-08-19 LAB — BASIC METABOLIC PANEL
CO2: 25 mEq/L (ref 19–32)
Calcium: 9.5 mg/dL (ref 8.4–10.5)
Chloride: 107 mEq/L (ref 96–112)
GFR calc Af Amer: 50 mL/min — ABNORMAL LOW (ref >90)
GFR calc non Af Amer: 43 mL/min — ABNORMAL LOW (ref >90)
Glucose, Bld: 169 mg/dL — ABNORMAL HIGH (ref 70–99)
Potassium: 4.9 mEq/L (ref 3.5–5.1)
Sodium: 139 mEq/L (ref 135–145)

## 2012-08-19 LAB — CBC
HCT: 29.7 % — ABNORMAL LOW (ref 36.0–46.0)
Hemoglobin: 9.1 g/dL — ABNORMAL LOW (ref 12.0–15.0)
MCH: 28 pg (ref 26.0–34.0)
MCHC: 30.6 g/dL (ref 30.0–36.0)
MCV: 91.4 fL (ref 78.0–100.0)
Platelets: 225 10*3/uL (ref 150–400)
RBC: 3.25 MIL/uL — ABNORMAL LOW (ref 3.87–5.11)
RDW: 13.8 % (ref 11.5–15.5)
WBC: 7.1 10*3/uL (ref 4.0–10.5)

## 2012-08-19 LAB — GLUCOSE, CAPILLARY: Glucose-Capillary: 148 mg/dL — ABNORMAL HIGH (ref 70–99)

## 2012-08-19 MED ORDER — POLYSACCHARIDE IRON COMPLEX 150 MG PO CAPS: 150.0000 mg | ORAL_CAPSULE | Freq: Every day | ORAL | Status: DC

## 2012-08-19 MED ORDER — OXYCODONE HCL 10 MG PO TABS: 10.0000 mg | ORAL_TABLET | ORAL | Status: DC | PRN

## 2012-08-19 MED ORDER — NITROGLYCERIN 0.3 MG/HR TD PT24: 1.0000 | MEDICATED_PATCH | Freq: Every day | TRANSDERMAL | Status: DC

## 2012-08-19 MED ORDER — OXYCODONE HCL ER 10 MG PO T12A
10.0000 mg | EXTENDED_RELEASE_TABLET | Freq: Two times a day (BID) | ORAL | Status: DC
  Administered 2012-08-19: 10 mg via ORAL
  Filled 2012-08-19: qty 1

## 2012-08-19 MED ORDER — ROSUVASTATIN CALCIUM 10 MG PO TABS: 10.0000 mg | ORAL_TABLET | Freq: Every day | ORAL | Status: DC

## 2012-08-19 MED ORDER — INSULIN ASPART 100 UNIT/ML ~~LOC~~ SOLN: 7.0000 [IU] | Freq: Three times a day (TID) | SUBCUTANEOUS | Status: DC

## 2012-08-19 MED ORDER — SENNA 8.6 MG PO TABS: 2.0000 | ORAL_TABLET | Freq: Every day | ORAL | Status: DC

## 2012-08-19 MED ORDER — OXYCODONE HCL ER 10 MG PO T12A: 10.0000 mg | EXTENDED_RELEASE_TABLET | Freq: Two times a day (BID) | ORAL | Status: DC

## 2012-08-19 MED ORDER — CARVEDILOL 25 MG PO TABS: 25.0000 mg | ORAL_TABLET | Freq: Two times a day (BID) | ORAL | Status: DC

## 2012-08-19 NOTE — Evaluation (Signed)
Physical Therapy Evaluation Patient Details Name: Kimberly Hardin MRN: [id number] DOB: 14-May-1950 Today's Date: 08/19/2012 Time: 3086-5784 PT Time Calculation (min): 26 min  PT Assessment / Plan / Recommendation Clinical Impression  Pt is a 62 year old female admitted for nephrolithiasis with obstructive uropathy with bil ureteral stents.  Pt would benefit from acute PT services in order to improve independence with transfers, ambulation and stairs.  Pt will have assist from spouse upon return home.      PT Assessment  Patient needs continued PT services    Follow Up Recommendations  Home health PT    Does the patient have the potential to tolerate intense rehabilitation      Barriers to Discharge        Equipment Recommendations  Rolling walker with 5" wheels (may need youth RW)    Recommendations for Other Services     Frequency Min 3X/week    Precautions / Restrictions Precautions Precautions: None   Pertinent Vitals/Pain SaO2 94% at rest on room air SaO2 92% during ambulation on room air      Mobility  Bed Mobility Bed Mobility: Not assessed Transfers Transfers: Sit to Stand;Stand to Sit Sit to Stand: From chair/3-in-1;From toilet;5: Supervision;With upper extremity assist Stand to Sit: To chair/3-in-1;To toilet;5: Supervision;With upper extremity assist Details for Transfer Assistance: assist to to bring trunk upright due to chair size however only supervision from toilet with wide BOS (likely due to body habitus) Ambulation/Gait Ambulation/Gait Assistance: 4: Min guard;5: Supervision Ambulation Distance (Feet): 160 Feet Assistive device: Rolling walker Ambulation/Gait Assistance Details: pt reports she feels more comfortable with RW for stability, states R lower back pain possibly referred from kidneys, pt reports SOB near end of ambulation however SaO2 remained 92% room air during ambulation Gait Pattern: Step-through pattern;Trunk flexed Gait velocity:  decreased General Gait Details: fatigues quickly    Exercises     PT Diagnosis: Difficulty walking;Generalized weakness  PT Problem List: Decreased strength;Decreased activity tolerance;Decreased mobility;Decreased knowledge of use of DME;Pain PT Treatment Interventions: DME instruction;Gait training;Stair training;Functional mobility training;Therapeutic activities;Therapeutic exercise;Patient/family education   PT Goals Acute Rehab PT Goals PT Goal Formulation: With patient Time For Goal Achievement: 08/26/12 Potential to Achieve Goals: Good Pt will go Sit to Stand: with modified independence PT Goal: Sit to Stand - Progress: Goal set today Pt will go Stand to Sit: with modified independence PT Goal: Stand to Sit - Progress: Goal set today Pt will Ambulate: 51 - 150 feet;with modified independence;with least restrictive assistive device PT Goal: Ambulate - Progress: Goal set today Pt will Go Up / Down Stairs: 1-2 stairs;with supervision;with least restrictive assistive device;with rail(s) PT Goal: Up/Down Stairs - Progress: Goal set today Pt will Perform Home Exercise Program: with supervision, verbal cues required/provided PT Goal: Perform Home Exercise Program - Progress: Goal set today  Visit Information  Last PT Received On: 08/19/12 Assistance Needed: +1    Subjective Data  Subjective: When I was younger, I broke my tail bone so it doesn't curve under like it's supposed to and sometimes I get pain/numbness in my LEs if I sit too long. Patient Stated Goal: agreeable to home with HHPT   Prior Functioning  Home Living Lives With: Spouse Type of Home: House Home Access: Stairs to enter [employer] of Steps: 5-6 short steps, able to fit RW on each step Entrance Stairs-Rails: Left;Right Home Layout: One level Home Adaptive Equipment: Tub transfer bench Additional Comments: new stairs into home built shorter and deeper for pt to  be able to negotiate Prior  Function Level of Independence: Independent with assistive device(s) Vocation: Full time employment Comments: Pt reports she sleeps in recliner Communication Communication: No difficulties    Cognition  Cognition Arousal/Alertness: Awake/alert Behavior During Therapy: WFL for tasks assessed/performed Overall Cognitive Status: Within Functional Limits for tasks assessed    Extremity/Trunk Assessment Right Lower Extremity Assessment RLE ROM/Strength/Tone: Deficits RLE ROM/Strength/Tone Deficits: pt reports increased weakness, grossly at least 3+/5 per functional observation RLE Sensation: Deficits RLE Sensation Deficits: pt reports sensation changes due to "tail bone" if she sits too long (numbness, tingling) Left Lower Extremity Assessment LLE ROM/Strength/Tone: Deficits LLE ROM/Strength/Tone Deficits: pt reports increased weakness, grossly at least 3+/5 per functional observation LLE Sensation: Deficits LLE Sensation Deficits: pt reports sensation changes due to "tail bone" if she sits too long (numbness, tingling)   Balance    End of Session PT - End of Session Activity Tolerance: Patient limited by fatigue Patient left: in chair;with call bell/phone within reach;with nursing in room  GP     Dominick Zertuche,KATHrine E 08/19/2012, 11:22 AM Zenovia Jarred, PT, DPT 08/19/2012 Pager: [phone]

## 2012-08-19 NOTE — Care Management Note (Signed)
    Page 1 of 1   08/19/2012     1:59:40 PM   CARE MANAGEMENT NOTE 08/19/2012  Patient:  LENIX, BENOIST   Account Number:  [id number]  Date Initiated:  08/13/2012  Documentation initiated by:  Jiles Crocker  Subjective/Objective Assessment:   ADMITTED FOR SURGERY - CYSTOSCOPY WITH RETROGRADE PYELOGRAM/URETERAL STENT PLACEMENT (Bilateral)     Action/Plan:   PCP: Ezequiel Kayser, MD  LIVES AT HOME WITH SPOUSE; CM FOLLOWING FOR DCP   Anticipated DC Date:  08/16/2012   Anticipated DC Plan:  HOME/SELF CARE      DC Planning Services  CM consult      Choice offered to / List presented to:             Status of service:  Completed, signed off Medicare Important Message given?  NA - LOS <3 / Initial given by admissions (If response is "NO", the following Medicare IM given date fields will be blank) Date Medicare IM given:   Date Additional Medicare IM given:    Discharge Disposition:  HOME/SELF CARE  Per UR Regulation:  Reviewed for med. necessity/level of care/duration of stay  If discussed at Long Length of Stay Meetings, dates discussed:    Comments:  08/13/2012- B CHANDLER RN,BSN,MHA

## 2012-08-19 NOTE — Progress Notes (Addendum)
I spoke with the patient is an alert this morning. She had a very difficult night due to recurrent ureteral colic. She has not had recurrent chest discomfort. Her hemoglobin is 9.1. Creatinine 1.36.  We plan no invasive cardiac evaluation at this time unless recurrent angina. Upon discharge I will arrange for office followup and in days to weeks after the office visit, if she is stable we will perform outpatient cardiac catheterization and possible PCI. She understands that if there is any recurrent chest discomfort we will move forward at that time. If she is been discharged and this occurs she will come to the emergency room.  I will continue to follow until she is discharged. Please call me if any questions about this plan.  Total time spent 35 minutes.  Cell [phone]

## 2012-08-19 NOTE — Discharge Summary (Signed)
Physician Discharge Summary  Kimberly Hardin XWR:[id number] DOB: Aug 08, 1950 DOA: 08/12/2012  PCP: Kimberly Kayser, MD  Admit date: 08/12/2012 Discharge date: 08/19/2012  Recommendations for Outpatient Follow-up:  1. Pt will need to follow up with PCP in 2 weeks post discharge 2. Please obtain BMP to evaluate electrolytes and kidney function 3. Please also check CBC to evaluate Hg and Hct levels 4. Follow up with Dr. Berneice Hardin, urology--Dr. Berneice Hardin to arrange 5. Follow up with cardiology, Dr. Verdis Hardin;  He will arrange follow up  Discharge Diagnoses:  Principal Problem:   Bilateral kidney stones Active Problems:   Acute kidney failure   UTI (lower urinary tract infection)   DM2 (diabetes mellitus, type 2)   HTN (hypertension)   Hyperkalemia, diminished renal excretion   Coronary atherosclerosis of native coronary artery   ACS (acute coronary syndrome)   Acute coronary syndrome   Anemia, iron deficiency Nephrolithiasis with obstructive uropathy  -S/P CYSTOSCOPY WITH RETROGRADE PYELOGRAM/URETERAL STENT PLACEMENT (Bilateral) on 08/13/12  -Appreciate Dr. Berneice Hardin  -Discussed w/ Dr. Berneice Hardin today--stents can remain indefinitely and No uteroscopic stone intervention is needed at this time  -d/c foley catheter  -stable for d/c per urology  -Regarding her intermittent ureteral colic, the patient only used 2-3 mg of hydromorphone daily. -The patient was discharged home with OxyContin 10 mg twice a day and oxycodone IR 10 mg every 4 hours when necessary breakthrough pain. Acute kidney injury  -continues to improve with IVF  -Decrease rate of IV fluids as patient's oral intake has improved  -I/O is -370cc in past 24hrs  -Likely multifactorial including dehydration (vomiting), obstruction, and meds (NSAID, ARB)  -Patient took approximately 5 doses of naproxen 500 mg and Saturday and date of admission  -Baseline creatinine 0.6-0.8  -08/12/2012 CT abdomen shows 8 mm left ureteral stone, 3 mm right ureteral  stone  -The patient's creatinine gradually improved throughout the hospitalization. Discharge creatinine 1.32. -The patient is to discontinue use of Benicar and metformin and follow up with her PCP, Dr. Waynard Hardin Acute coronary syndrome/NSTEMI  -Night of 08/13/2012 Patient had chest discomfort accompanied by abnormal EKG and elevated troponins  -Cardiology is following--due to the patient's renal insufficiency/ AKI--cardiac catheterization is deferred at this time as the patient improved with medical treatment. The patient will follow up with Dr. Verdis Hardin is an outpatient for future determination of cardiac catheterization. The patient was continued on aspirin 325 mg daily. -Continue carvedilol 25mg  bid  -Continue longacting nitrate --patient was placed on nitro paste daily. She will be discharged with this. -Heparin drip discontinued (08/15/2012)  -Patient is currently pain-free  -Cardiac catheterization pending improving renal function  -Dual antiplatelet therapy okay with current ureteral stents  -LDL 104  -Patient started on Crestor--tolerating well  -Repeat echocardiogram on 08/14/2012 showed EF 50-55% with grade 1 diastolic dysfunction and lateral hypokinesis Iron deficiency  -Iron saturation 11%, ferritin 132  -given a dose of Feraheme during the hospitalization -Patient has intolerances to ferrous sulfate, try Niferex which the patient tolerated and will be discharged with this -Baseline hemoglobin 11-12  -Likely dilutional as all cell lines have decreased  -Remained hemodynamically stable  Pyuria  -Likely due to nephrolithiasis  -Followup urine culture--> negative  -Discontinue ceftriaxone  Diabetes mellitus type 2  -Discontinue metformin  -NovoLog sliding scale  -Increase NovoLog 7 units pre-meal  -Increase Lantus 22 units as by mouth intake has gradually increased  -hemoglobin A1c--8.8  -Patient endorses some noncompliance with diet at home  Hypertension  -Hold ARB due to  AKI  -Blood pressure was creeping up, carvedilol increased to 25 mg twice a day Hyperkalemia  -Mild--> improved  -Continue IV fluids  -Continue telemetry  -Continue to follow BMP  Anxiety  -Patient takes alprazolam when necessary home  -Restart low-dose alprazolam po, IV ativan if cannot take po  -Patient can restart taking her home dose of alprazolam when necessary anxiety Right lower extremity pain and edema  -Venous ultrasound rule out DVT--negative  Constipation  -Try bisacodyl--patient has had small bowel movements  -Add Senokot to Colace  -Abdominal x-ray without obstruction or free air  Coronary artery disease with history of stent  -Cardiac catheterization 09/14/2010 with DES  -Finished one year of uninterrupted aspirin and Plavix  -Continue aspirin 325 daily  Family Communication: Husband at beside  Disposition Plan: Home 08/19/12 if OK with cardiology  Procedures:  -cystoscopy with b/l ureteral stents--08/13/12  Antibiotics:  Ceftriaxone 08/12/2012>>> 08/14/2012  Family Communication: husband at beside    Discharge Condition: Stable  Disposition:  home  Diet: 1800-calorie ADA Wt Readings from Last 3 Encounters:  08/19/12 119.7 kg (263 lb 14.3 oz)  08/12/12 111.585 kg (246 lb)  08/19/12 119.7 kg (263 lb 14.3 oz)    History of present illness:  62 y.o. female who presents with c/o flank pain. Flank pain initially started on her L side on Friday but really became severe today on her R side. Pain is 10/10, severe and radiates down her flank. When patient saw doctor on Friday she was started on pain meds including naproxen. Patient had CT scan as out patient which demonstrated B kidney stones (3mm L sided stone, and 8mm R sided stone and Bilateral  "fullness" of the collecting system). Because of her symptoms she was referred to the ED.  In the ED patient was treated with ketorlac before her labs came back, when her labs came back she was found to have pyuria, and AKF  with creatinine of 2.74 (was 0.7 4 days ago). Hospitalist has been asked to admit the patient.     Consultants: Urology, Dr. Berneice Hardin Cardiology, Dr. Verdis Hardin Discharge Exam: Filed Vitals:   08/19/12 0828  BP:   Pulse: 62  Temp:   Resp:    Filed Vitals:   08/19/12 0232 08/19/12 0500 08/19/12 0521 08/19/12 0828  BP: 139/67  148/71   Pulse: 58  58 62  Temp: 98.5 F (36.9 C)  98.6 F (37 C)   TempSrc: Oral  Oral   Resp: 18  18   Height:      Weight:  119.7 kg (263 lb 14.3 oz)    SpO2: 99%  99%    General: A&O x 3, NAD, pleasant, cooperative Cardiovascular: RRR, no rub, no gallop, no S3 Respiratory: CTAB, no wheeze, no rhonchi Abdomen:soft, NT, nondistended, positive bowel sounds Extremities: 1+ edema, No lymphangitis, no petechiae  Discharge Instructions      Discharge Orders   Future Orders Complete By Expires     Diet - low sodium heart healthy  As directed     Increase activity slowly  As directed         Medication List    STOP taking these medications       amoxicillin-clavulanate 875-125 MG per tablet  Commonly known as:  AUGMENTIN     metFORMIN 1000 MG tablet  Commonly known as:  GLUCOPHAGE     olmesartan 40 MG tablet  Commonly known as:  BENICAR     OVER THE COUNTER MEDICATION  oxyCODONE-acetaminophen 5-325 MG per tablet  Commonly known as:  PERCOCET/ROXICET      TAKE these medications       acetaminophen 325 MG tablet  Commonly known as:  TYLENOL  Take 650 mg by mouth every 6 (six) hours as needed for pain.     aspirin 325 MG tablet  Take 325-650 mg by mouth daily.     carvedilol 25 MG tablet  Commonly known as:  COREG  Take 1 tablet (25 mg total) by mouth 2 (two) times daily with a meal.     CoQ10 200 MG Caps  Take 200 mg by mouth daily.     docusate sodium 100 MG capsule  Commonly known as:  COLACE  Take 1 capsule (100 mg total) by mouth every 12 (twelve) hours.     insulin aspart 100 UNIT/ML injection  Commonly known  as:  novoLOG  Inject 7 Units into the skin 3 (three) times daily with meals.     insulin glargine 100 UNIT/ML injection  Commonly known as:  LANTUS  Inject 22 Units into the skin at bedtime.     iron polysaccharides 150 MG capsule  Commonly known as:  NIFEREX  Take 1 capsule (150 mg total) by mouth daily.     L-CARNITINE PO  Take 1 tablet by mouth daily.     Levocarnitine 250 MG Caps  Take 1 capsule by mouth daily.     Lysine 500 MG Tabs  Take 2 tablets by mouth daily.     multivitamin with minerals Tabs  Take 1 tablet by mouth daily.     naproxen 500 MG tablet  Commonly known as:  NAPROSYN  Take 1 tablet (500 mg total) by mouth 2 (two) times daily with a meal.     nitroGLYCERIN 0.3 mg/hr  Commonly known as:  NITRODUR - Dosed in mg/24 hr  Place 1 patch (0.3 mg total) onto the skin daily.     Oxycodone HCl 10 MG Tabs  Take 1 tablet (10 mg total) by mouth every 4 (four) hours as needed.     OxyCODONE 10 mg T12a  Commonly known as:  OXYCONTIN  Take 1 tablet (10 mg total) by mouth every 12 (twelve) hours.     polyethylene glycol powder powder  Commonly known as:  GLYCOLAX/MIRALAX  Take 17 g by mouth 2 (two) times daily. Until daily soft stools    OTC     rosuvastatin 10 MG tablet  Commonly known as:  CRESTOR  Take 1 tablet (10 mg total) by mouth at bedtime.     senna 8.6 MG Tabs  Commonly known as:  SENOKOT  Take 2 tablets (17.2 mg total) by mouth daily.     VITAMIN B 12 PO  Take 2 tablets by mouth daily.     VITAMIN D-3 PO  Take 200 mg by mouth daily.         The results of significant diagnostics from this hospitalization (including imaging, microbiology, ancillary and laboratory) are listed below for reference.    Significant Diagnostic Studies: Ct Abdomen Pelvis W Contrast  08/12/2012  *RADIOLOGY REPORT*  Clinical Data: Right lower quadrant pain, nausea, vomiting.  CT ABDOMEN AND PELVIS WITH CONTRAST  Technique:  Multidetector CT imaging of the  abdomen and pelvis was performed following the standard protocol during bolus administration of intravenous contrast.  Contrast: OMNIPAQUE IOHEXOL 300 MG/ML  SOLN  Comparison: None.  Findings: Heart is normal size.  Lung bases are clear.  No effusions.  Suspect mild fatty infiltration of the liver.  No focal abnormality.  Gallbladder, stomach, spleen, pancreas, adrenals are unremarkable.  There is an 8 mm stone within the proximal left ureter.  Mild fullness of the left renal collecting system.  3 mm distal right ureteral stone near the right UVJ.  Slight fullness of the right renal collecting system.  Small nonobstructing stone in the lower pole of the right kidney.  Appendix is visualized and is normal. Bowel grossly unremarkable. No free fluid, free air, or adenopathy.  Uterus, adnexa are unremarkable.  Urinary bladder decompressed.  There is a moderate sized umbilical hernia containing fat.  No acute bony abnormality.  Aortic and iliac calcifications.  No aneurysm.  IMPRESSION: 8 mm proximal left ureteral stone.  3 mm distal right ureteral stone.  Mild fullness of the renal collecting systems bilaterally.  Right nephrolithiasis.  Normal appendix.  Umbilical hernia containing fat.   Original Report Authenticated By: Charlett Nose, M.D.    Dg Abd Portable 2v  08/15/2012  *RADIOLOGY REPORT*  Clinical Data: Abdominal pain and constipation.  Abdominal distension.  PORTABLE ABDOMEN - 2 VIEW  Comparison: CT abdomen and pelvis 08/12/2012  Findings: Study is technically limited due to the patient's body habitus.  Interval placement of bilateral ureteral stents with proximal pigtails projected over the expected location of the kidneys and distal pigtails projected over the expected location of the bladder.  Renal stones demonstrated CT are not definitely shown radiographically. Scattered gas and stool in the colon.  No small or large bowel distension.  No free intra-abdominal air.  No abnormal air fluid levels.   IMPRESSION: Bilateral ureteral stents.  Nonobstructive bowel gas pattern.   Original Report Authenticated By: Burman Nieves, M.D.      Microbiology: Recent Results (from the past 240 hour(s))  URINE CULTURE     Status: None   Collection Time    08/12/12  5:30 PM      Result Value Range Status   Specimen Description URINE, CATHETERIZED   Final   Special Requests NONE   Final   Culture  Setup Time 08/12/2012 20:06   Final   Colony Count NO GROWTH   Final   Culture NO GROWTH   Final   Report Status 08/14/2012 FINAL   Final     Labs: Basic Metabolic Panel:  Recent Labs Lab 08/15/12 0500 08/16/12 0415 08/17/12 0441 08/18/12 0423 08/19/12 0435  NA 135 137 136 140 139  K 5.0 4.9 5.0 5.0 4.9  CL 104 107 107 110 107  CO2 24 21 24 24 25   GLUCOSE 228* 219* 249* 244* 169*  BUN 35* 35* 30* 29* 26*  CREATININE 1.71* 1.65* 1.35* 1.29* 1.32*  CALCIUM 8.4 8.6 8.6 8.8 9.5   Liver Function Tests: No results found for this basename: AST, ALT, ALKPHOS, BILITOT, PROT, ALBUMIN,  in the last 168 hours No results found for this basename: LIPASE, AMYLASE,  in the last 168 hours No results found for this basename: AMMONIA,  in the last 168 hours CBC:  Recent Labs Lab 08/12/12 1705  08/15/12 0500 08/16/12 0415 08/17/12 0441 08/18/12 0423 08/19/12 0435  WBC 10.5  < > 7.1 5.7 5.4 6.1 7.1  NEUTROABS 8.4*  --   --   --   --   --   --   HGB 11.8*  < > 8.9* 8.6* 8.2* 8.6* 9.1*  HCT 34.4*  < > 27.9* 26.8* 25.8* 27.1* 29.7*  MCV 86.2  < >  90.0 89.6 89.9 90.6 91.4  PLT 204  < > 183 177 188 206 225  < > = values in this interval not displayed. Cardiac Enzymes:  Recent Labs Lab 08/13/12 2200 08/14/12 0244 08/14/12 0830  TROPONINI <0.30 1.89* 4.36*   BNP: No components found with this basename: POCBNP,  CBG:  Recent Labs Lab 08/18/12 1201 08/18/12 1717 08/18/12 1723 08/18/12 2118 08/19/12 0758  GLUCAP 193* 249* 244* 246* 148*    Time coordinating discharge:  Greater than 30  minutes  Signed:  Antania Hoefling, DO [facility] Pager: [phone] 08/19/2012, 11:55 AM

## 2012-10-01 ENCOUNTER — Other Ambulatory Visit: Payer: Self-pay | Admitting: Urology

## 2012-10-02 ENCOUNTER — Other Ambulatory Visit: Payer: Self-pay | Admitting: Urology

## 2012-10-05 MED ORDER — GENTAMICIN SULFATE 40 MG/ML IJ SOLN: 80.0000 mg | Freq: Once | INTRAVENOUS | Status: AC

## 2012-10-09 ENCOUNTER — Other Ambulatory Visit: Payer: Self-pay | Admitting: Urology

## 2012-10-09 ENCOUNTER — Encounter (HOSPITAL_COMMUNITY): Payer: Self-pay | Admitting: Pharmacy Technician

## 2012-10-09 ENCOUNTER — Ambulatory Visit (HOSPITAL_COMMUNITY)
Admission: RE | Admit: 2012-10-09 | Discharge: 2012-10-09 | Disposition: A | Discharge status: Home / Self Care | Payer: 59 | Source: Ambulatory Visit | Attending: Urology | Admitting: Urology

## 2012-10-09 ENCOUNTER — Encounter (HOSPITAL_COMMUNITY)
Admission: RE | Admit: 2012-10-09 | Discharge: 2012-10-09 | Disposition: A | Discharge status: Home / Self Care | Payer: 59 | Source: Ambulatory Visit | Attending: Urology | Admitting: Urology

## 2012-10-09 ENCOUNTER — Encounter (HOSPITAL_COMMUNITY): Payer: Self-pay

## 2012-10-09 DIAGNOSIS — Z01818 Encounter for other preprocedural examination: Secondary | ICD-10-CM | POA: Insufficient documentation

## 2012-10-09 DIAGNOSIS — N201 Calculus of ureter: Secondary | ICD-10-CM | POA: Insufficient documentation

## 2012-10-09 HISTORY — DX: Personal history of urinary calculi: Z87.442

## 2012-10-09 HISTORY — DX: Anemia, unspecified: D64.9

## 2012-10-09 HISTORY — DX: Personal history of other diseases of urinary system: Z87.448

## 2012-10-09 HISTORY — DX: Polyneuropathy, unspecified: G62.9

## 2012-10-09 HISTORY — DX: Constipation, unspecified: K59.00

## 2012-10-09 HISTORY — DX: Unspecified asthma, uncomplicated: J45.909

## 2012-10-09 LAB — BASIC METABOLIC PANEL
BUN: 29 mg/dL — ABNORMAL HIGH (ref 6–23)
Calcium: 9.5 mg/dL (ref 8.4–10.5)
Chloride: 106 mEq/L (ref 96–112)
Creatinine, Ser: 0.92 mg/dL (ref 0.50–1.10)
GFR calc Af Amer: 77 mL/min — ABNORMAL LOW (ref >90)
GFR calc non Af Amer: 67 mL/min — ABNORMAL LOW (ref >90)

## 2012-10-09 LAB — CBC
HCT: 34.4 % — ABNORMAL LOW (ref 36.0–46.0)
MCH: 28.9 pg (ref 26.0–34.0)
MCHC: 32.6 g/dL (ref 30.0–36.0)
MCV: 88.7 fL (ref 78.0–100.0)
Platelets: 236 10*3/uL (ref 150–400)
RDW: 13.6 % (ref 11.5–15.5)
WBC: 6.6 10*3/uL (ref 4.0–10.5)

## 2012-10-09 LAB — SURGICAL PCR SCREEN
MRSA, PCR: NEGATIVE
Staphylococcus aureus: POSITIVE — AB

## 2012-10-09 NOTE — Patient Instructions (Addendum)
Kimberly Hardin  10/09/2012                           YOUR PROCEDURE IS SCHEDULED ON: 10/15/12               PLEASE REPORT TO SHORT STAY CENTER AT : 6:15 AM               CALL THIS NUMBER IF ANY PROBLEMS THE DAY OF SURGERY :               [phone]                      REMEMBER:   Do not eat food or drink liquids AFTER MIDNIGHT    Take these medicines the morning of surgery with A SIP OF WATER: CARVEDILOL / MAY TAKE XANAX OR OXYCODONE IF NEEDED / TAKE ONLY 1/2 DOSE OF INSULIN THE NIGHT BEFORE SURG   Do not wear jewelry, make-up   Do not wear lotions, powders, or perfumes.   Do not shave legs or underarms 12 hrs. before surgery (men may shave face)  Do not bring valuables to the hospital.  Contacts, dentures or bridgework may not be worn into surgery.  Leave suitcase in the car. After surgery it may be brought to your room.  For patients admitted to the hospital more than one night, checkout time is 11:00                          The day of discharge.   Patients discharged the day of surgery will not be allowed to drive home                             If going home same day of surgery, must have someone stay with you first                           24 hrs at home and arrange for some one to drive you home from hospital.    Special Instructions:   Please read over the following fact sheets that you were given:               1. MRSA  INFORMATION                      2. [facility] PREPARING FOR SURGERY SHEET                                                X_____________________________________________________________________        Failure to follow these instructions may result in cancellation of your surgery

## 2012-10-14 MED ORDER — GENTAMICIN SULFATE 40 MG/ML IJ SOLN
340.0000 mg | Freq: Once | INTRAVENOUS | Status: AC
  Administered 2012-10-15: 340 mg via INTRAVENOUS
  Filled 2012-10-14: qty 8.5

## 2012-10-14 NOTE — Anesthesia Preprocedure Evaluation (Addendum)
Anesthesia Evaluation  Patient identified by MRN, date of birth, ID band Patient awake    Reviewed: Allergy & Precautions, H&P , NPO status , Patient's Chart, lab work & pertinent test results  Airway Mallampati: III TM Distance: >3 FB Neck ROM: full    Dental no notable dental hx. (+) Teeth Intact and Dental Advisory Given   Pulmonary asthma , sleep apnea ,  breath sounds clear to auscultation  Pulmonary exam normal       Cardiovascular Exercise Tolerance: Good hypertension, Pt. on medications + CAD, + Past MI and + Cardiac Stents Rhythm:regular Rate:Normal     Neuro/Psych Anxiety negative neurological ROS  negative psych ROS   GI/Hepatic negative GI ROS, Neg liver ROS,   Endo/Other  negative endocrine ROSdiabetes, Well Controlled, Type 2, Insulin DependentMorbid obesity  Renal/GU ARFRenal disease  negative genitourinary   Musculoskeletal   Abdominal Normal abdominal exam  (+) + obese,   Peds  Hematology negative hematology ROS (+)   Anesthesia Other Findings   Reproductive/Obstetrics negative OB ROS                          Anesthesia Physical Anesthesia Plan  ASA: III  Anesthesia Plan: General   Post-op Pain Management:    Induction: Intravenous  Airway Management Planned: LMA  Additional Equipment:   Intra-op Plan:   Post-operative Plan: Extubation in OR  Informed Consent: I have reviewed the patients History and Physical, chart, labs and discussed the procedure including the risks, benefits and alternatives for the proposed anesthesia with the patient or authorized representative who has indicated his/her understanding and acceptance.   Dental advisory given  Plan Discussed with: CRNA  Anesthesia Plan Comments:         Anesthesia Quick Evaluation

## 2012-10-15 ENCOUNTER — Encounter (HOSPITAL_COMMUNITY): Payer: Self-pay | Admitting: Anesthesiology

## 2012-10-15 ENCOUNTER — Ambulatory Visit (HOSPITAL_COMMUNITY)
Admission: RE | Admit: 2012-10-15 | Discharge: 2012-10-15 | Disposition: A | Discharge status: Home / Self Care | Payer: 59 | Source: Ambulatory Visit | Attending: Urology | Admitting: Urology

## 2012-10-15 ENCOUNTER — Ambulatory Visit (HOSPITAL_COMMUNITY): Payer: 59 | Admitting: Anesthesiology

## 2012-10-15 ENCOUNTER — Encounter (HOSPITAL_COMMUNITY): Payer: Self-pay | Admitting: *Deleted

## 2012-10-15 ENCOUNTER — Encounter (HOSPITAL_COMMUNITY)
Admission: RE | Disposition: A | Discharge status: Home / Self Care | Payer: Self-pay | Source: Ambulatory Visit | Attending: Urology

## 2012-10-15 DIAGNOSIS — Z794 Long term (current) use of insulin: Secondary | ICD-10-CM | POA: Insufficient documentation

## 2012-10-15 DIAGNOSIS — I2510 Atherosclerotic heart disease of native coronary artery without angina pectoris: Secondary | ICD-10-CM | POA: Insufficient documentation

## 2012-10-15 DIAGNOSIS — N179 Acute kidney failure, unspecified: Secondary | ICD-10-CM | POA: Insufficient documentation

## 2012-10-15 DIAGNOSIS — I252 Old myocardial infarction: Secondary | ICD-10-CM | POA: Insufficient documentation

## 2012-10-15 DIAGNOSIS — Z79899 Other long term (current) drug therapy: Secondary | ICD-10-CM | POA: Insufficient documentation

## 2012-10-15 DIAGNOSIS — E785 Hyperlipidemia, unspecified: Secondary | ICD-10-CM | POA: Insufficient documentation

## 2012-10-15 DIAGNOSIS — N201 Calculus of ureter: Secondary | ICD-10-CM | POA: Insufficient documentation

## 2012-10-15 DIAGNOSIS — N390 Urinary tract infection, site not specified: Secondary | ICD-10-CM | POA: Insufficient documentation

## 2012-10-15 DIAGNOSIS — E119 Type 2 diabetes mellitus without complications: Secondary | ICD-10-CM | POA: Insufficient documentation

## 2012-10-15 DIAGNOSIS — I10 Essential (primary) hypertension: Secondary | ICD-10-CM | POA: Insufficient documentation

## 2012-10-15 DIAGNOSIS — G4733 Obstructive sleep apnea (adult) (pediatric): Secondary | ICD-10-CM | POA: Insufficient documentation

## 2012-10-15 HISTORY — PX: HOLMIUM LASER APPLICATION: SHX5852

## 2012-10-15 HISTORY — PX: CYSTOSCOPY WITH RETROGRADE PYELOGRAM, URETEROSCOPY AND STENT PLACEMENT: SHX5789

## 2012-10-15 LAB — GLUCOSE, CAPILLARY
Glucose-Capillary: 216 mg/dL — ABNORMAL HIGH (ref 70–99)
Glucose-Capillary: 177 mg/dL — ABNORMAL HIGH (ref 70–99)

## 2012-10-15 SURGERY — CYSTOURETEROSCOPY, WITH RETROGRADE PYELOGRAM AND STENT INSERTION
Anesthesia: General | Laterality: Bilateral | Wound class: Clean Contaminated

## 2012-10-15 MED ORDER — ACETAMINOPHEN 10 MG/ML IV SOLN
INTRAVENOUS | Status: AC
  Administered 2012-10-15: 1000 mg
  Filled 2012-10-15: qty 100

## 2012-10-15 MED ORDER — MIDAZOLAM HCL 5 MG/5ML IJ SOLN
INTRAMUSCULAR | Status: DC | PRN
  Administered 2012-10-15 (×2): 1 mg via INTRAVENOUS

## 2012-10-15 MED ORDER — HYDROMORPHONE HCL PF 1 MG/ML IJ SOLN
0.2500 mg | INTRAMUSCULAR | Status: DC | PRN
Start: 2012-10-15 — End: 2012-10-15
  Administered 2012-10-15 (×2): 0.5 mg via INTRAVENOUS

## 2012-10-15 MED ORDER — LACTATED RINGERS IV SOLN: INTRAVENOUS | Status: DC

## 2012-10-15 MED ORDER — SODIUM CHLORIDE 0.9 % IR SOLN
Status: DC | PRN
  Administered 2012-10-15: 3000 mL via INTRAVESICAL

## 2012-10-15 MED ORDER — OXYCODONE-ACETAMINOPHEN 5-325 MG PO TABS: 1.0000 | ORAL_TABLET | Freq: Four times a day (QID) | ORAL | Status: DC | PRN

## 2012-10-15 MED ORDER — CEPHALEXIN 500 MG PO CAPS: 500.0000 mg | ORAL_CAPSULE | Freq: Two times a day (BID) | ORAL | Status: DC

## 2012-10-15 MED ORDER — IOHEXOL 300 MG/ML  SOLN
INTRAMUSCULAR | Status: AC
  Filled 2012-10-15: qty 1

## 2012-10-15 MED ORDER — HYDROMORPHONE HCL PF 1 MG/ML IJ SOLN
INTRAMUSCULAR | Status: AC
  Filled 2012-10-15: qty 1

## 2012-10-15 MED ORDER — IOHEXOL 300 MG/ML  SOLN
INTRAMUSCULAR | Status: DC | PRN
  Administered 2012-10-15: 45 mL

## 2012-10-15 MED ORDER — PROPOFOL 10 MG/ML IV BOLUS
INTRAVENOUS | Status: DC | PRN
  Administered 2012-10-15: 140 mg via INTRAVENOUS

## 2012-10-15 MED ORDER — ACETAMINOPHEN 10 MG/ML IV SOLN
1000.0000 mg | Freq: Four times a day (QID) | INTRAVENOUS | Status: DC
  Filled 2012-10-15 (×3): qty 100

## 2012-10-15 MED ORDER — LACTATED RINGERS IV SOLN
INTRAVENOUS | Status: DC | PRN
  Administered 2012-10-15 (×2): via INTRAVENOUS

## 2012-10-15 MED ORDER — FENTANYL CITRATE 0.05 MG/ML IJ SOLN: 25.0000 ug | INTRAMUSCULAR | Status: DC | PRN

## 2012-10-15 MED ORDER — ONDANSETRON HCL 4 MG/2ML IJ SOLN
INTRAMUSCULAR | Status: DC | PRN
  Administered 2012-10-15: 4 mg via INTRAVENOUS

## 2012-10-15 MED ORDER — LIDOCAINE HCL (CARDIAC) 20 MG/ML IV SOLN
INTRAVENOUS | Status: DC | PRN
  Administered 2012-10-15: 50 mg via INTRAVENOUS

## 2012-10-15 MED ORDER — FENTANYL CITRATE 0.05 MG/ML IJ SOLN
INTRAMUSCULAR | Status: DC | PRN
  Administered 2012-10-15: 25 ug via INTRAVENOUS
  Administered 2012-10-15: 50 ug via INTRAVENOUS
  Administered 2012-10-15: 25 ug via INTRAVENOUS
  Administered 2012-10-15: 100 ug via INTRAVENOUS
  Administered 2012-10-15 (×2): 25 ug via INTRAVENOUS

## 2012-10-15 MED ORDER — PROMETHAZINE HCL 25 MG/ML IJ SOLN
6.2500 mg | INTRAMUSCULAR | Status: DC | PRN
  Administered 2012-10-15: 6.25 mg via INTRAVENOUS

## 2012-10-15 MED ORDER — EPHEDRINE SULFATE 50 MG/ML IJ SOLN
INTRAMUSCULAR | Status: DC | PRN
  Administered 2012-10-15: 5 mg via INTRAVENOUS

## 2012-10-15 MED ORDER — PROMETHAZINE HCL 25 MG/ML IJ SOLN
INTRAMUSCULAR | Status: AC
  Filled 2012-10-15: qty 1

## 2012-10-15 SURGICAL SUPPLY — 24 items
BAG URINE DRAINAGE (UROLOGICAL SUPPLIES) ×1 IMPLANT
BASKET LASER NITINOL 1.9FR (BASKET) ×1 IMPLANT
BASKET STNLS GEMINI 4WIRE 3FR (BASKET) IMPLANT
BASKET ZERO TIP NITINOL 2.4FR (BASKET) IMPLANT
BSKT STON RTRVL 120 1.9FR (BASKET) ×1
BSKT STON RTRVL GEM 120X11 3FR (BASKET)
BSKT STON RTRVL ZERO TP 2.4FR (BASKET)
CATH INTERMIT  6FR 70CM (CATHETERS) ×2 IMPLANT
DRAPE CAMERA CLOSED 9X96 (DRAPES) ×2 IMPLANT
ELECT REM PT RETURN 9FT ADLT (ELECTROSURGICAL)
ELECTRODE REM PT RTRN 9FT ADLT (ELECTROSURGICAL) IMPLANT
GLOVE BIOGEL M STRL SZ7.5 (GLOVE) ×2 IMPLANT
GOWN PREVENTION PLUS LG XLONG (DISPOSABLE) ×1 IMPLANT
GOWN STRL REIN XL XLG (GOWN DISPOSABLE) ×2 IMPLANT
GUIDEWIRE ANG ZIPWIRE 038X150 (WIRE) ×2 IMPLANT
GUIDEWIRE STR DUAL SENSOR (WIRE) ×2 IMPLANT
IV NS IRRIG 3000ML ARTHROMATIC (IV SOLUTION) ×2 IMPLANT
LASER FIBER DISP (UROLOGICAL SUPPLIES) ×1 IMPLANT
PACK CYSTO (CUSTOM PROCEDURE TRAY) ×2 IMPLANT
SHEATH ACCESS URETERAL 38CM (SHEATH) ×1 IMPLANT
STENT CONTOUR 6FRX24X.038 (STENTS) ×2 IMPLANT
SYRINGE 10CC LL (SYRINGE) ×1 IMPLANT
SYRINGE IRR TOOMEY STRL 70CC (SYRINGE) IMPLANT
TUBE FEEDING 8FR 16IN STR KANG (MISCELLANEOUS) ×2 IMPLANT

## 2012-10-15 NOTE — Transfer of Care (Signed)
Immediate Anesthesia Transfer of Care Note  Patient: Kimberly Hardin  Procedure(s) Performed: Procedure(s): BILATERAL URETERSCOPIC STONE MANIPULATION, LEFT URETERAL STONE BASKETRY, BILATERAL RETROGRADES AND STENT EXCHANGECHANGE (Bilateral) HOLMIUM LASER APPLICATION (Bilateral)  Patient Location: PACU  Anesthesia Type:General  Level of Consciousness: awake, alert  and oriented  Airway & Oxygen Therapy: Patient Spontanous Breathing and Patient connected to face mask oxygen  Post-op Assessment: Report given to PACU RN and Post -op Vital signs reviewed and stable  Post vital signs: Reviewed and stable  Complications: No apparent anesthesia complications

## 2012-10-15 NOTE — Brief Op Note (Signed)
10/15/2012  10:10 AM  PATIENT:  Kimberly Hardin  62 y.o. female  PRE-OPERATIVE DIAGNOSIS:  BILATERAL URETERAL STONES  POST-OPERATIVE DIAGNOSIS:  bilateral ureteral stones  PROCEDURE:  Procedure(s): BILATERAL URETERSCOPIC STONE MANIPULATION, LEFT URETERAL STONE BASKETRY, BILATERAL RETROGRADES AND STENT EXCHANGECHANGE (Bilateral) HOLMIUM LASER APPLICATION (Bilateral)  SURGEON:  Surgeon(s) and Role:    * Sebastian Ache, MD - Primary  PHYSICIAN ASSISTANT:   ASSISTANTS: none   ANESTHESIA:   general  EBL:  Total I/O In: 1000 [I.V.:1000] Out: -   BLOOD ADMINISTERED:none  DRAINS: none   LOCAL MEDICATIONS USED:  NONE  SPECIMEN:  Source of Specimen:  1 - Left Renal Stone  DISPOSITION OF SPECIMEN:  [facility] for compositional analysis  COUNTS:  YES  TOURNIQUET:  * No tourniquets in log *  DICTATION: .Other Dictation: Dictation Number [id number]  PLAN OF CARE: Discharge to home after PACU  PATIENT DISPOSITION:  PACU - hemodynamically stable.   Delay start of Pharmacological VTE agent (>24hrs) due to surgical blood loss or risk of bleeding: not applicable

## 2012-10-15 NOTE — H&P (Signed)
Kimberly Hardin is an 62 y.o. female.    Chief Complaint: Pre-op Bilateral Ureteroscopic Stone Manipulation  HPI:    1 - Bilateral Nephrolithiasis - s/p urgen bilateral stents 08/12/12 for Rt 3mm UVJ stone and Lt 8mm prox stone wit bilateral hydro and acute renal failure. No prior episodes.   2 - Acute Renal Failure - Cr peak 3.11 during time of bilateral stones as per above, now most recnetly 1.32 08/2012.  PMH sig for CAD/MI/Stents, OSA, Morbid Obesity, DM. Her PCP is [facility]. Her cardiologist is Verdis Prime MD.  Today Kimberly Hardin is seen to proceed with bilateral ureteroscopic stone manipulation. She has had recent e. Coli cystitis that has been treated with CX-specific ABX and CX-specific proph ABX in preparation for surgery today. She has cardiac clearance.   Past Medical History  Diagnosis Date  . MI (myocardial infarction)     2 stents  . Diabetes mellitus without complication   . Hypertension   . Hyperlipidemia   . Allergic rhinitis   . Obesity   . Asthma   . Neuropathy   . Constipation   . History of kidney stones   . History of renal failure   . Anemia   . OSA (obstructive sleep apnea)     USES NASAL PRONGS  . Anxiety     PANIC ATTACKS    Past Surgical History  Procedure Laterality Date  . Eye surgery  3 years ago    laser surgery  . Cystoscopy w/ ureteral stent placement Bilateral 08/12/2012    Procedure: CYSTOSCOPY WITH RETROGRADE PYELOGRAM/URETERAL STENT PLACEMENT;  Surgeon: Sebastian Ache, MD;  Location: [facility];  Service: Urology;  Laterality: Bilateral;  . Coronary angioplasty  2011/ 2012    CARDIAC STENTS X4    Family History  Problem Relation Age of Onset  . Diabetes Father   . Heart failure Father   . Breast cancer Mother   . Diabetes Sister   . Stomach cancer Maternal Grandfather   . Diabetes Paternal Grandmother   . Diabetes Paternal Grandfather    Social History:  reports that she has never smoked. She has never used smokeless tobacco. She  reports that she does not drink alcohol or use illicit drugs.  Allergies:  Allergies  Allergen Reactions  . Darvon (Propoxyphene Hcl) Nausea And Vomiting and Other (See Comments)    Headaches   . Flagyl (Metronidazole) Other (See Comments)    Unknown   . Levaquin (Levofloxacin In D5w) Other (See Comments)    hallucinations  . Librium (Chlordiazepoxide) Other (See Comments)    Dizziness   . Minocycline Other (See Comments)    Severe nausea and vomiting, severe diarrhea  . Morphine And Related Other (See Comments)    Seeing hallucinations, out of body experience-floating above her body feeling  . Prednisone Other (See Comments)    Vision disturbances, completely unfocused sight and caused some eyesight damage, after stopped taking med   . Statins Other (See Comments)    Caused muscle cramps  . Sudafed (Pseudoephedrine Hcl) Other (See Comments)    Causes High blood pressure  . Valium (Diazepam) Other (See Comments)    Hallucinations, caused tunnel vision.  . Zoloft (Sertraline Hcl) Other (See Comments)    nightmares    Medications Prior to Admission  Medication Sig Dispense Refill  . albuterol (PROVENTIL HFA;VENTOLIN HFA) 108 (90 BASE) MCG/ACT inhaler Inhale 2 puffs into the lungs every 6 (six) hours as needed for wheezing.      Marland Kitchen ALPRAZolam (  XANAX) 0.25 MG tablet Take 0.125 mg by mouth daily as needed for anxiety.      . Amino Acids (L-CARNITINE PO) Take 500 mg by mouth daily.       Marland Kitchen aspirin 325 MG tablet Take 325 mg by mouth daily.       . carvedilol (COREG) 25 MG tablet Take 1 tablet (25 mg total) by mouth 2 (two) times daily with a meal.  60 tablet  1  . Cholecalciferol (VITAMIN D) 2000 UNITS tablet Take 2,000 Units by mouth daily.      . Coenzyme Q10 (COQ10) 200 MG CAPS Take 200 mg by mouth daily.      Marland Kitchen docusate sodium (COLACE) 100 MG capsule Take 200 mg by mouth 2 (two) times daily.      . insulin aspart (NOVOLOG) 100 UNIT/ML injection Inject 14 Units into the skin 3  (three) times daily with meals.      . insulin glargine (LANTUS) 100 UNIT/ML injection Inject 30 Units into the skin at bedtime.       Marland Kitchen loratadine (CLARITIN) 10 MG tablet Take 10 mg by mouth at bedtime.      Marland Kitchen LORazepam (ATIVAN) 0.5 MG tablet Take 0.5 mg by mouth 3 (three) times daily as needed for anxiety.      Marland Kitchen Lysine 500 MG TABS Take 1 tablet by mouth 2 (two) times daily.       . nitroGLYCERIN (NITRODUR - DOSED IN MG/24 HR) 0.4 mg/hr Place 1 patch onto the skin daily.      . ondansetron (ZOFRAN) 8 MG tablet Take by mouth every 8 (eight) hours as needed for nausea.      Marland Kitchen OVER THE COUNTER MEDICATION Take 1 tablet by mouth 2 (two) times daily. Quercetin with bromelain 400/83      . OVER THE COUNTER MEDICATION Take 1 tablet by mouth daily. Tumeric, vitamin c, rose hips, metamucil, magnesium citrate      . OxyCODONE (OXYCONTIN) 10 mg T12A Take 1 tablet (10 mg total) by mouth every 12 (twelve) hours.  60 tablet  0  . oxyCODONE 10 MG TABS Take 1 tablet (10 mg total) by mouth every 4 (four) hours as needed.  50 tablet  0  . oxyCODONE-acetaminophen (PERCOCET/ROXICET) 5-325 MG per tablet Take 1 tablet by mouth every 6 (six) hours as needed.      . promethazine (PHENERGAN) 25 MG suppository Place 25 mg rectally at bedtime as needed for nausea.      Marland Kitchen pyridOXINE (VITAMIN B-6) 100 MG tablet Take 100 mg by mouth daily.      . rosuvastatin (CRESTOR) 10 MG tablet Take 10 mg by mouth at bedtime.      . vitamin B-12 (CYANOCOBALAMIN) 100 MCG tablet Take 500 mcg by mouth daily.        No results found for this or any previous visit (from the past 48 hour(s)). No results found.  Review of Systems  Constitutional: Negative.  Negative for fever and chills.  HENT: Negative.   Eyes: Negative.   Respiratory: Negative.   Cardiovascular: Negative.   Gastrointestinal: Negative.   Genitourinary: Negative.  Negative for hematuria and flank pain.  Musculoskeletal: Negative.   Skin: Negative.   Neurological:  Negative.   Endo/Heme/Allergies: Negative.   Psychiatric/Behavioral: Negative.     Blood pressure 173/52, pulse 58, temperature 98.5 F (36.9 C), temperature source Oral, resp. rate 18, SpO2 98.00%. Physical Exam  Constitutional: She is oriented to person, place, and time. She  appears well-developed and well-nourished.  Morbidly obese  HENT:  Head: Normocephalic and atraumatic.  Eyes: EOM are normal. Pupils are equal, round, and reactive to light.  Neck: Normal range of motion. Neck supple.  Cardiovascular: Normal rate and regular rhythm.   Respiratory: Effort normal and breath sounds normal.  GI: Soft. Bowel sounds are normal.  Genitourinary:  No CVAT  Musculoskeletal: Normal range of motion.  Neurological: She is alert and oriented to person, place, and time.  Skin: Skin is warm and dry.  Psychiatric: She has a normal mood and affect. Her behavior is normal. Judgment and thought content normal.     Assessment/Plan  1 - Bilateral Nephrolithiasis - Now temporized with stents, but needs definitive managment with ureteroscopy. Shockwave not possible with body habitus.  We rediscussed ureteroscopic stone manipulation with basketing and laser-lithotripsy in detail.  We rediscussed risks including bleeding, infection, damage to kidney / ureter  bladder, rarely loss of kidney. We rediscussed anesthetic risks and rare but serious surgical complications including DVT, PE, MI, and mortality. We specifically readdressed that in 5-10% of cases a staged approach is required with stenting followed by re-attempt ureteroscopy if anatomy unfavorable. The patient voiced understanding and wises to proceed.   Although she hasn cardiac clearance, she understands that she is at significant cardiopulmonary risk regardless of approach.  2 - Acute Renal Failure - Now resolved wtih relieve of obstructive uropathy.  Chinedu Agustin 10/15/2012, 6:41 AM

## 2012-10-15 NOTE — Anesthesia Postprocedure Evaluation (Signed)
Anesthesia Post Note  Patient: Kimberly Hardin  Procedure(s) Performed: Procedure(s) (LRB): BILATERAL URETERSCOPIC STONE MANIPULATION, LEFT URETERAL STONE BASKETRY, BILATERAL RETROGRADES AND STENT EXCHANGECHANGE (Bilateral) HOLMIUM LASER APPLICATION (Bilateral)  Anesthesia type: General  Patient location: PACU  Post pain: Pain level controlled  Post assessment: Post-op Vital signs reviewed  Last Vitals:  Filed Vitals:   10/15/12 1144  BP: 126/96  Pulse: 59  Temp: 36.6 C  Resp: 14    Post vital signs: Reviewed  Level of consciousness: sedated  Complications: No apparent anesthesia complications

## 2012-10-15 NOTE — Progress Notes (Signed)
CBG called to Dr. Rica Mast no orders given.

## 2012-10-16 ENCOUNTER — Encounter (HOSPITAL_COMMUNITY): Payer: Self-pay | Admitting: Urology

## 2012-10-16 NOTE — Op Note (Signed)
NAMELELLA, MULLANY NO.:  [id number]  MEDICAL RECORD NO.:  [id number]  LOCATION:  [facility]                         FACILITY:  [facility]  PHYSICIAN:  Sebastian Ache, MD     DATE OF BIRTH:  1950-10-10  DATE OF PROCEDURE: 10/15/2012 DATE OF DISCHARGE:  10/15/2012                              OPERATIVE REPORT   DIAGNOSES:  History of bilateral ureteral stones, acute renal failure, and urosepsis.  PROCEDURES: 1. Cystoscopy with bilateral retrograde pyelograms and interpretation. 2. Exchange of bilateral ureteral stents, 6 x 24, no tether. 3. Right ureteroscopy with basketing of ureteral stone. 4. Left ureteroscopic laser lithotripsy.  ESTIMATED BLOOD LOSS:  Nil.  COMPLICATIONS:  None.  SPECIMEN:  Left renal stone for compositional analysis.  FINDINGS: 1. Unremarkable urinary bladder. 2. Moderate bilateral stent encrustation. 3. Small volume right intrarenal stones.  These were all irrigated.     No right ureteral stones. 4. Relatively large approximately 9 mm left intrarenal stone, likely     corresponding to previous left ureteral stone.  This was completely     removed.  INDICATION:  Kimberly Hardin is a pleasant 62 year old lady with multiple medical comorbidities including coronary artery disease, morbid obesity, who presented in late April with acute renal failure and urosepsis, and bilateral ureteral stones.  She underwent stenting at that time as a temporizing measure as well as culture specific antibiotic therapy.  She had an MI at that time as well.  She says only has had resolution of her acute cardiac issues with cardiac clearance for definitive stone management.  She has been afebrile without clinical infections.  She has had recurrent bacteriuria with E. coli and has been on culture specific antibiotics for this.  She now presents for definitive stone management. Informed consent was obtained and placed in medical record.  PROCEDURE IN DETAIL:  The  patient being Kimberly Hardin, procedure being bilateral ureteroscopic stone manipulation was confirmed.  Procedure was carried out.  Time-out was performed.  IV antibiotics administered. General LMA anesthesia was introduced.  The patient placed into a low lithotomy position.  Sterile field was created by prepping and draping the patient's vagina, introitus, and proximal thighs using iodine x3. Next, cystourethroscopy was performed using a 22-French rigid cystoscope with 12-degree offset lens.  Inspection of the urinary bladder revealed bilateral distal end of ureteral stent that were moderately encrusted with associated periureteral edema as expected.  Attention was initially directed to the right side.  The distal end of the right stent was grasped and the stent brought out in its entirety.  Right ureteral orifice was then gently cannulated using a 6-French end-hole catheter and right retrograde pyelogram was obtained.  Right retrograde pyelogram demonstrated a single right ureter, single system, right kidney.  No filling defects or narrowing noted.  A 0.038 Glidewire was advanced at the level of the upper pole and set aside as a safety wire.  Next, semi-rigid ureteroscopy was performed of the entire length of the right ureter alongside a separate Sensor working wire.  No mucosal abnormalities or  calcifications were noted.  Next, the semi- rigid ureteroscope was exchanged for a 38 cm 12/14 ureteral access sheath at the  level of the proximal ureter with fluoroscopic vision. Next, flexible digital ureteroscopy was performed using 8-French digital ureteroscope.  Panendoscopy of the right kidney revealed multifocal small stones, each which were smaller than 0.5 mm in diameter.  These were not amenable to basketing as such.  There were irrigated out their entirety using flushing technique with 20 mL of aliquots of normal saline via the cystoscope taking great care to avoid excessive  renal pressure.  This was able to irrigate approximately 95% of the small fragments.  Repeat inspection following these maneuvers revealed excellent hemostasis.  No evidence of any perforation and no evidence of stones larger than one-third millimeter.  The sheath was then removed under continuous ureteroscopic vision.  No mucosal abnormalities were found.  Finally, a new 6 x 24 double-J stent was placed over the remaining right safety wire.  Good proximal and distal curl were noted. Attention was directed to the left side.  Similarly, the left ureteral stent distal end was grasped and the stent was brought out its entirety and left retrograde pyelogram was obtained.  Left retrograde pyelogram demonstrated a single left ureter, single system left kidney.  No filling defects or narrowing noted.  A 0.038 Glidewire was advanced at the level of the upper pole and set aside as a safety wire.  Next, semi-rigid ureteroscopy was performed the entire length of left ureter alongside a separate Sensor working wire.  No mucosal abnormalities or calcifications were noted.  The semi-rigid ureteroscope was exchanged for the 38 cm 12/14 ureteral access sheath under continuous fluoroscopy to the level of the proximal ureter.  Next, flexible ureteroscopy was performed of the entire left kidney.  There was a single dominant calcification in the lower mid pole calyx corresponding to likely prior ureteral stone.  This was approximately 9 mm.  This appeared to be too large for simple basketing.  As such, holmium laser energy was applied to the stone using setting 0.5 joules and 5 Hz.  This was fragmented to approximately 6 smaller fragments. Each of these were grasped in escape type basket and brought out in their entirety, set aside for compositional analysis.  Repeat ureteroscopy revealed complete resolution of all stone fragments.  No evidence of perforation and there was excellent hemostasis.  The  left- sided sheath was removed under continuous ureteroscopic vision.  No mucosal abnormalities were found.  Finally, a new left-sided ureteral stent was placed 6 x 24, over the remaining safety wire using fluoroscopic guidance.  Good proximal and distal curl were noted. Bladder was emptied per cystoscope.  Procedure was then terminated.  The patient tolerated the procedure well.  There were no immediate periprocedural complications.  The patient taken to postanesthesia care unit in stable condition.          ______________________________ Sebastian Ache, MD     TM/MEDQ  D:  10/15/2012  T:  10/16/2012  Job:  [id number]

## 2012-11-20 DIAGNOSIS — H4310 Vitreous hemorrhage, unspecified eye: Secondary | ICD-10-CM | POA: Insufficient documentation

## 2012-11-24 ENCOUNTER — Other Ambulatory Visit: Payer: Self-pay | Admitting: Interventional Cardiology

## 2012-11-24 ENCOUNTER — Encounter (HOSPITAL_COMMUNITY): Payer: Self-pay | Admitting: Pharmacy Technician

## 2012-11-25 ENCOUNTER — Other Ambulatory Visit: Payer: Self-pay | Admitting: Interventional Cardiology

## 2012-11-27 ENCOUNTER — Encounter (HOSPITAL_COMMUNITY)
Admission: RE | Disposition: A | Discharge status: Home / Self Care | Payer: Self-pay | Source: Ambulatory Visit | Attending: Interventional Cardiology

## 2012-11-27 ENCOUNTER — Ambulatory Visit (HOSPITAL_COMMUNITY)
Admission: RE | Admit: 2012-11-27 | Discharge: 2012-11-27 | Disposition: A | Discharge status: Home / Self Care | Payer: 59 | Source: Ambulatory Visit | Attending: Interventional Cardiology | Admitting: Interventional Cardiology

## 2012-11-27 DIAGNOSIS — E119 Type 2 diabetes mellitus without complications: Secondary | ICD-10-CM | POA: Insufficient documentation

## 2012-11-27 DIAGNOSIS — R9439 Abnormal result of other cardiovascular function study: Secondary | ICD-10-CM | POA: Diagnosis present

## 2012-11-27 DIAGNOSIS — I2510 Atherosclerotic heart disease of native coronary artery without angina pectoris: Secondary | ICD-10-CM | POA: Insufficient documentation

## 2012-11-27 DIAGNOSIS — Z9861 Coronary angioplasty status: Secondary | ICD-10-CM | POA: Insufficient documentation

## 2012-11-27 DIAGNOSIS — I252 Old myocardial infarction: Secondary | ICD-10-CM | POA: Insufficient documentation

## 2012-11-27 HISTORY — PX: LEFT HEART CATHETERIZATION WITH CORONARY ANGIOGRAM: SHX5451

## 2012-11-27 SURGERY — LEFT HEART CATHETERIZATION WITH CORONARY ANGIOGRAM
Anesthesia: LOCAL

## 2012-11-27 MED ORDER — SODIUM CHLORIDE 0.9 % IV SOLN: INTRAVENOUS | Status: DC

## 2012-11-27 MED ORDER — SODIUM CHLORIDE 0.9 % IJ SOLN: 3.0000 mL | Freq: Two times a day (BID) | INTRAMUSCULAR | Status: DC

## 2012-11-27 MED ORDER — ACETAMINOPHEN 325 MG PO TABS: 650.0000 mg | ORAL_TABLET | ORAL | Status: DC | PRN

## 2012-11-27 MED ORDER — HEPARIN (PORCINE) IN NACL 2-0.9 UNIT/ML-% IJ SOLN
INTRAMUSCULAR | Status: AC
Start: 2012-11-27 — End: 2012-11-27
  Filled 2012-11-27: qty 1500

## 2012-11-27 MED ORDER — OXYCODONE-ACETAMINOPHEN 5-325 MG PO TABS: 1.0000 | ORAL_TABLET | ORAL | Status: DC | PRN

## 2012-11-27 MED ORDER — LIDOCAINE HCL (PF) 1 % IJ SOLN
INTRAMUSCULAR | Status: AC
  Filled 2012-11-27: qty 30

## 2012-11-27 MED ORDER — FENTANYL CITRATE 0.05 MG/ML IJ SOLN
INTRAMUSCULAR | Status: AC
  Filled 2012-11-27: qty 2

## 2012-11-27 MED ORDER — ONDANSETRON HCL 4 MG/2ML IJ SOLN: 4.0000 mg | Freq: Four times a day (QID) | INTRAMUSCULAR | Status: DC | PRN

## 2012-11-27 MED ORDER — SODIUM CHLORIDE 0.9 % IV SOLN: 250.0000 mL | INTRAVENOUS | Status: DC | PRN

## 2012-11-27 MED ORDER — SODIUM CHLORIDE 0.9 % IJ SOLN: 3.0000 mL | INTRAMUSCULAR | Status: DC | PRN

## 2012-11-27 MED ORDER — SODIUM CHLORIDE 0.9 % IV SOLN
INTRAVENOUS | Status: DC
  Administered 2012-11-27: 09:00:00 via INTRAVENOUS

## 2012-11-27 MED ORDER — ASPIRIN 81 MG PO CHEW: 324.0000 mg | CHEWABLE_TABLET | ORAL | Status: AC

## 2012-11-27 MED ORDER — HEPARIN SODIUM (PORCINE) 1000 UNIT/ML IJ SOLN
INTRAMUSCULAR | Status: AC
  Filled 2012-11-27: qty 1

## 2012-11-27 MED ORDER — MIDAZOLAM HCL 2 MG/2ML IJ SOLN
INTRAMUSCULAR | Status: AC
  Filled 2012-11-27: qty 2

## 2012-11-27 MED ORDER — ASPIRIN 81 MG PO CHEW
CHEWABLE_TABLET | ORAL | Status: AC
  Administered 2012-11-27: 324 mg via ORAL
  Filled 2012-11-27: qty 4

## 2012-11-27 MED ORDER — VERAPAMIL HCL 2.5 MG/ML IV SOLN
INTRAVENOUS | Status: AC
  Filled 2012-11-27: qty 2

## 2012-11-27 NOTE — CV Procedure (Signed)
     Diagnostic Cardiac Catheterization Report  Kimberly Hardin  62 y.o.  female 08/19/1950  Procedure Date: 11/27/2012 Referring Physician: Louretta Shorten [name], M.D. Primary Cardiologist:: [facility]WB [name], M.D.   PROCEDURE:  Left heart catheterization with selective coronary angiography, left ventriculogram.  INDICATIONS:  High risk myocardial perfusion scan. Patient denies angina. There is exertional dyspnea. Prior history of the LAD stenting (x2) and proximal circumflex stenting.  The risks, benefits, and details of the procedure were explained to the patient.  The patient verbalized understanding and wanted to proceed.  Informed written consent was obtained.  PROCEDURE TECHNIQUE:  After Xylocaine anesthesia a 5 French slender sheath was placed in the right radial artery with a single anterior needle wall stick.   Coronary angiography was done using a 5 [place] left(3.5 cm) and right(4 cm) Judkins catheter.  Left ventriculography was done using the right Judkins catheter.    CONTRAST:  Total of 60 cc.  COMPLICATIONS:  None.    HEMODYNAMICS:  Aortic pressure was 155/62 mmHg; LV pressure was 156/2 mmHg; LVEDP 8 mmHg.  There was no gradient between the left ventricle and aorta.    ANGIOGRAPHIC DATA:   The left main coronary artery is distal 30% narrowing.  The left anterior descending artery is a long segment of proximal stents is noted. Eccentric 80% focal ISR is noted in the proximal stented region. Distal vessel is relatively small..  The left circumflex artery is mid stent is widely patent. Subtotal occlusion distal to the stent with 95+ percent stenosis and competitive flow via collaterals from the right coronary..  The right coronary artery is dominant and supplies collaterals to the circumflex. PDA contains 50% narrowing.Marland Kitchen  LEFT VENTRICULOGRAM:  Left ventricular angiogram was done in the 30 RAO projection and revealed normal left ventricular wall motion and systolic function with an  estimated ejection fraction of 50%.    IMPRESSIONS:  1. Proximal focal ISR in LAD stent. Subtotal occlusion mid to distal circumflex beyond patent stents in the mid vessel. Left to right collaterals from the right coronary to the distal circumflex.  2. Widely patent RCA collateralized in the circumflex  3. Overall normal LV function   RECOMMENDATION:  Clinical management decisions include continued medical therapy versus consideration of coronary artery grafting in this diabetic who has had proximal LAD restenosis x2 and now has progression of disease in the circumflex. Will discuss with colleagues including one of the cardiac surgeons.Marland Kitchen

## 2012-11-27 NOTE — H&P (Addendum)
  Cath Lab Visit (complete for each Cath Lab visit)  Clinical Evaluation Leading to the Procedure:   ACS: yes  Non-ACS:    Anginal Classification: CCS II  Anti-ischemic medical therapy: Minimal Therapy (1 class of medications)  Non-Invasive Test Results: High-risk stress test findings: cardiac mortality >3%/year  Prior CABG: No previous CABG      Kimberly Hardin is 62 years of age and in March soft with a non-ST elevation myocardial infarction in the perioperative phase of urosepsis/ureteral stent implantation. She had acute kidney failure. She is undergone multiple renal/urological procedures. Angina has not recurred. A nuclear study performed earlier this month demonstrated lateral wall and anterior wall ischemia. Low normal LV function was noted. The studies being done to assess LAD and circumflex stent patency to help guide therapy. The LAD was stented in the setting of acute anterior infarction. He was free stented in 2012 for restenosis.  The procedure and risks have been discussed with the patient in office in detail including stroke, death, myocardial infarction, bleeding, infection, kidney failure, among others. These risks were 3 discuss this morning and the patient is willing to proceed.

## 2012-12-01 ENCOUNTER — Encounter: Payer: Self-pay | Admitting: *Deleted

## 2012-12-02 ENCOUNTER — Encounter: Payer: Self-pay | Admitting: Surgery

## 2012-12-02 ENCOUNTER — Institutional Professional Consult (permissible substitution) (INDEPENDENT_AMBULATORY_CARE_PROVIDER_SITE_OTHER): Payer: 59 | Admitting: Surgery

## 2012-12-02 VITALS — BP 196/79 | HR 61 | Resp 20 | Ht 59.5 in | Wt 228.0 lb

## 2012-12-02 DIAGNOSIS — I2510 Atherosclerotic heart disease of native coronary artery without angina pectoris: Secondary | ICD-10-CM

## 2012-12-19 DIAGNOSIS — H2513 Age-related nuclear cataract, bilateral: Secondary | ICD-10-CM | POA: Insufficient documentation

## 2013-01-02 ENCOUNTER — Encounter: Payer: Self-pay | Admitting: Surgery

## 2013-01-02 NOTE — Progress Notes (Signed)
[address]       [place] [address]             [phone]        PCP is Ezequiel Kayser, MD Referring Provider is Perini, Redge Gainer, MD  Chief Complaint  Patient presents with  . Coronary Artery Disease    Surgical eval for CABG, cardiac cath 11/27/12, ECHO 08/14/12    HPI:  The patient is a 62 year old woman with a history of diabetes, hypertension, hyperlipiemia, and coronary artery disease who suffered an anterior MI in 11/2009 and had a bare metal stent placed in the proximal LAD. A month later she had a DES placed in the mid LCX for a 90% stenosis. She presented with recurrent angina in 08/2010 and was found to have total occlusion of the proximal LAD within the stented segment. This was opened and a DES was placed. Then in March 2014 she suffered a NSTEMI in the setting of urosepsis and ureteral stent implantation with acute kidney failure. A nuclear study showed lateral wall and anterior wall ischemia with low normal EF. Cardiac cath on 11/27/2012 showed eccentric 80% focal ISR within the stented region in the proximal LAD. The distal vessel is small. The LCX stent is widely patent with subtotal occlusion distal to the stent with 95% stenosis and competitive flow from the RCA. The PDA has 50% stenosis. EF was 50%  Past Medical History  Diagnosis Date  . MI (myocardial infarction)     2 stents  . Diabetes mellitus without complication   . Hypertension   . Hyperlipidemia   . Allergic rhinitis   . Obesity   . Asthma   . Neuropathy   . Constipation   . History of kidney stones   . History of renal failure   . Anemia   . OSA (obstructive sleep apnea)     USES NASAL PRONGS  . Anxiety     PANIC ATTACKS    Past Surgical History  Procedure Laterality Date  . Eye surgery  3 years ago    laser surgery  . Cystoscopy w/ ureteral stent placement Bilateral 08/12/2012    Procedure: CYSTOSCOPY WITH RETROGRADE PYELOGRAM/URETERAL STENT PLACEMENT;  Surgeon: Sebastian Ache, MD;  Location: [facility];  Service: Urology;  Laterality: Bilateral;  . Coronary angioplasty  2011/ 2012    CARDIAC STENTS X4  . Cystoscopy with retrograde pyelogram, ureteroscopy and stent placement Bilateral 10/15/2012    Procedure: BILATERAL URETERSCOPIC STONE MANIPULATION, LEFT URETERAL STONE BASKETRY, BILATERAL RETROGRADES AND STENT EXCHANGECHANGE;  Surgeon: Sebastian Ache, MD;  Location: [facility];  Service: Urology;  Laterality: Bilateral;  . Holmium laser application Bilateral 10/15/2012    Procedure: HOLMIUM LASER APPLICATION;  Surgeon: Sebastian Ache, MD;  Location: [facility];  Service: Urology;  Laterality: Bilateral;    Family History  Problem Relation Age of Onset  . Diabetes Father   . Heart failure Father   . Breast cancer Mother   . Diabetes Sister   . Stomach cancer Maternal Grandfather   . Diabetes Paternal Grandmother   . Diabetes Paternal Grandfather     Social History History  Substance Use Topics  . Smoking status: Never Smoker   . Smokeless tobacco: Never Used  . Alcohol Use: No    Current Outpatient Prescriptions  Medication Sig Dispense Refill  . albuterol (PROVENTIL HFA;VENTOLIN HFA) 108 (90 BASE) MCG/ACT inhaler Inhale 2 puffs into the lungs every 6 (six) hours as needed  for wheezing.      Marland Kitchen ALPRAZolam (XANAX) 0.25 MG tablet Take 0.125 mg by mouth daily as needed for anxiety.      Marland Kitchen aspirin 325 MG tablet Take 325 mg by mouth daily.       . carvedilol (COREG) 25 MG tablet Take 25 mg by mouth 2 (two) times daily with a meal.      . cetirizine (ZYRTEC) 10 MG tablet Take 10 mg by mouth at bedtime.      . Coenzyme Q10 (COQ10) 200 MG CAPS Take 200 mg by mouth daily.      . insulin aspart (NOVOLOG) 100 UNIT/ML injection Inject 16 Units into the skin 3 (three) times daily with meals.       . insulin glargine (LANTUS) 100 UNIT/ML injection Inject 30 Units into the skin at bedtime.       . nitroGLYCERIN (NITRODUR - DOSED IN MG/24 HR) 0.4 mg/hr Place 1 patch onto the  skin daily.      . nitroGLYCERIN (NITROSTAT) 0.4 MG SL tablet Place 0.4 mg under the tongue every 5 (five) minutes as needed for chest pain.      . Omega-3 Fatty Acids (FISH OIL) 1000 MG CAPS Take 1 capsule by mouth daily.      . promethazine (PHENERGAN) 25 MG suppository Place 25 mg rectally at bedtime as needed for nausea.      . rosuvastatin (CRESTOR) 10 MG tablet Take 10 mg by mouth daily.      . TURMERIC PO Take 800 mg by mouth daily.       No current facility-administered medications for this visit.   Facility-Administered Medications Ordered in Other Visits  Medication Dose Route Frequency Provider Last Rate Last Dose  . gentamicin (GARAMYCIN) 80 mg in dextrose 5 % 50 mL IVPB  80 mg Intravenous Once Sebastian Ache, MD        Allergies  Allergen Reactions  . Darvon [Propoxyphene Hcl] Nausea And Vomiting and Other (See Comments)    Headaches   . Betadine [Povidone Iodine]     Hives    . Flagyl [Metronidazole] Other (See Comments)    Unknown   . Levaquin [Levofloxacin In D5w] Other (See Comments)    hallucinations  . Librium [Chlordiazepoxide] Other (See Comments)    Dizziness   . Minocycline Other (See Comments)    Severe nausea and vomiting, severe diarrhea  . Morphine And Related Other (See Comments)    Seeing hallucinations, out of body experience-floating above her body feeling  . Prednisone Other (See Comments)    Vision disturbances, completely unfocused sight and caused some eyesight damage, after stopped taking med   . Statins Other (See Comments)    Caused muscle cramps  . Sudafed [Pseudoephedrine Hcl] Other (See Comments)    Causes High blood pressure  . Valium [Diazepam] Other (See Comments)    Hallucinations, caused tunnel vision.  Marland Kitchen Zoloft [Sertraline Hcl] Other (See Comments)    nightmares    Review of Systems  Constitutional: Positive for activity change and fatigue.  HENT: Negative.   Eyes: Negative.   Respiratory: Negative for cough and  shortness of breath.   Cardiovascular: Negative for chest pain, palpitations and leg swelling.  Gastrointestinal: Negative.   Endocrine: Negative.   Genitourinary:       Kidney stones  Musculoskeletal: Negative.   Skin: Negative.   Allergic/Immunologic: Negative.   Neurological: Negative.   Hematological: Negative.   Psychiatric/Behavioral: The patient is nervous/anxious.  BP 196/79  Pulse 61  Resp 20  Ht 4' 11.5" (1.511 m)  Wt 228 lb (103.42 kg)  BMI 45.3 kg/m2  SpO2 98% Physical Exam  Constitutional: She is oriented to person, place, and time. She appears well-developed and well-nourished. No distress.  HENT:  Head: Normocephalic and atraumatic.  Mouth/Throat: Oropharynx is clear and moist.  Eyes: EOM are normal. Pupils are equal, round, and reactive to light.  Neck: Normal range of motion. Neck supple. No JVD present. No thyromegaly present.  Cardiovascular: Normal rate, regular rhythm and normal heart sounds.   No murmur heard. Pulmonary/Chest: Effort normal and breath sounds normal. No respiratory distress. She has no rales.  Abdominal: Soft. Bowel sounds are normal. She exhibits no distension and no mass. There is no tenderness.  Musculoskeletal: Normal range of motion. She exhibits no edema.  Lymphadenopathy:    She has no cervical adenopathy.  Neurological: She is alert and oriented to person, place, and time. She has normal strength. No cranial nerve deficit or sensory deficit.  Skin: Skin is warm and dry.  Psychiatric: She has a normal mood and affect.     Diagnostic Tests:  *[place]*               *[facility]*                       [address]                     [place], [place] [address]                         [phone]   ------------------------------------------------------------ Transthoracic Echocardiography  Patient:    Kimberly Hardin, Kimberly Hardin MR #:       [id number] Study Date: 08/14/2012 Gender:     F Age:         57 Height:     149.9cm Weight:     112.7kg BSA:        2.74m^2 Pt. Status: Room:       1428    PERFORMING   [facility]  SONOGRAPHER  Nolon Rod, RDCS  ATTENDING    Tat, Dorothea Ogle cc:  ------------------------------------------------------------ LV EF: 50% -   55%  ------------------------------------------------------------ Indications:      Chest pain 786.51.  ------------------------------------------------------------ History:   Risk factors:  Hypertension. Diabetes mellitus.   ------------------------------------------------------------ Study Conclusions  - Left ventricle: The cavity size was normal. There was mild   focal basal hypertrophy of the septum. Systolic function   was normal. The estimated ejection fraction was in the   range of 50% to 55%. Possible hypokinesis of the lateral   myocardium. Doppler parameters are consistent with   abnormal left ventricular relaxation (grade 1 diastolic   dysfunction). - Mitral valve: Calcified annulus. Transthoracic echocardiography.  M-mode, complete 2D, spectral Doppler, and color Doppler.  Height:  Height: 149.9cm. Height: 59in.  Weight:  Weight: 112.7kg. Weight: 248lb.  Body mass index:  BMI: 50.2kg/m^2.  Body surface area:    BSA: 2.39m^2.  Blood pressure:     135/62.  Patient status:  Inpatient.  Location:  Bedside.  ------------------------------------------------------------  ------------------------------------------------------------ Left ventricle:  Not well visualized. The cavity size was normal. There was mild focal basal hypertrophy of the septum. Systolic function  was normal. The estimated ejection fraction was in the range of 50% to 55%.  Regional wall motion abnormalities:   Possible hypokinesis of the lateral myocardium. Doppler parameters are consistent with abnormal left ventricular relaxation (grade 1  diastolic dysfunction).   ------------------------------------------------------------ Aortic valve:   Mildly calcified leaflets. Cusp separation was normal.  Doppler:  Transvalvular velocity was within the normal range. There was no stenosis.  No regurgitation.  ------------------------------------------------------------ Aorta:  The aorta was normal, not dilated, and non-diseased.   ------------------------------------------------------------ Mitral valve:   Calcified annulus.  Doppler:   No significant regurgitation.    Peak gradient: 4mm Hg (D).  ------------------------------------------------------------ Left atrium:  The atrium was normal in size.  ------------------------------------------------------------ Right ventricle:  The cavity size was normal. Wall thickness was normal. Systolic function was normal.  ------------------------------------------------------------ Pulmonic valve:   Poorly visualized.  Structurally normal valve.   Cusp separation was normal.  Doppler: Transvalvular velocity was within the normal range.  No regurgitation.  ------------------------------------------------------------ Tricuspid valve:   Structurally normal valve.   Leaflet separation was normal.  Doppler:  Transvalvular velocity was within the normal range.  No regurgitation.  ------------------------------------------------------------ Pulmonary artery:   Poorly visualized. The main pulmonary artery was normal-sized.  ------------------------------------------------------------ Right atrium:  The atrium was normal in size.  ------------------------------------------------------------ Pericardium:  The pericardium was normal in appearance. There was no pericardial effusion.  ------------------------------------------------------------ Systemic veins:  Not well visualized.   ------------------------------------------------------------ Post procedure conclusions Ascending  Aorta:  - The aorta was normal, not dilated, and non-diseased.  ------------------------------------------------------------  2D measurements        Normal  Doppler               Normal Left ventricle                 measurements LVID ED,   46.1 mm     43-52   Left ventricle chord,                         Ea, lat      8.38 cm/ ------- PLAX                           ann, tiss         s LVID ES,   30.6 mm     23-38   DP chord,                         E/Ea, lat   12.29     ------- PLAX                           ann, tiss FS, chord,   34 %      >29     DP PLAX                           Ea, med      6.73 cm/ ------- LVPW, ED     11 mm     ------  ann, tiss         s IVS/LVPW    1.2        <1.3    DP ratio, ED                      E/Ea, med  15.3     ------- Ventricular septum             ann, tiss IVS, ED    13.2 mm     ------  DP Aorta                          Mitral valve Root diam,   28 mm     ------  Peak E vel    103 cm/ ------- ED                                               s Left atrium                    Peak A vel   86.4 cm/ ------- AP dim       33 mm     ------                    s AP dim     1.47 cm/m^2 <2.2    Deceleratio   218 ms  150-230 index                          n time                                Peak            4 mm  -------                                gradient, D       Hg                                Peak E/A      1.2     -------                                ratio   ------------------------------------------------------------ Prepared and Electronically Authenticated by  Donato Schultz 2014-05-01T14:06:28.240      HEMODYNAMICS:  Aortic pressure was 155/62 mmHg; LV pressure was 156/2 mmHg; LVEDP 8 mmHg.  There was no gradient between the left ventricle and aorta.    ANGIOGRAPHIC DATA:   The left main coronary artery is distal 30% narrowing.  The left anterior descending artery is a long segment of proximal stents is noted. Eccentric 80% focal  ISR is noted in the proximal stented region. Distal vessel is relatively small..  The left circumflex artery is mid stent is widely patent. Subtotal occlusion distal to the stent with 95+ percent stenosis and competitive flow via collaterals from the right coronary..  The right coronary artery is dominant and supplies collaterals to the circumflex. PDA contains 50% narrowing.Marland Kitchen  LEFT VENTRICULOGRAM:  Left ventricular angiogram was done in the 30 RAO projection and revealed normal left ventricular wall motion and systolic function with an estimated ejection fraction of 50%.    IMPRESSIONS:  1. Proximal focal ISR in LAD stent. Subtotal occlusion mid to distal circumflex beyond patent stents in the mid  vessel. Left to right collaterals from the right coronary to the distal circumflex.  2. Widely patent RCA collateralized in the circumflex  3. Overall normal LV function   RECOMMENDATION:  Clinical management decisions include continued medical therapy versus consideration of coronary artery grafting in this diabetic who has had proximal LAD restenosis x2 and now has progression of disease in the circumflex. Will discuss with colleagues including one of the cardiac surgeons..     Impression:  She has significant proximal LAD restenosis within the stented segment having had prior LAD occlusion within the stented segment as well as high grade LCX stenosis just beyond the stented area. I think her best long term prognosis will be with CABG to the LAD and LCX. The PDA stenosis is not significant enough to warrant bypass at this time. I discussed the operative procedure with the patient  including alternatives, benefits and risks; including but not limited to bleeding, blood transfusion, infection, stroke, myocardial infarction, graft failure, heart block requiring a permanent pacemaker, organ dysfunction, and death.  Milas Kocher understands and would like to think about this further and discuss with Dr.  Katrinka Blazing and Dr. Darcus Austin. She will call us back if she decides to proceed. Plan:

## 2013-02-16 DIAGNOSIS — Z0271 Encounter for disability determination: Secondary | ICD-10-CM

## 2013-02-19 ENCOUNTER — Other Ambulatory Visit: Payer: Self-pay

## 2013-03-07 ENCOUNTER — Encounter: Payer: Self-pay | Admitting: Interventional Cardiology

## 2013-03-10 ENCOUNTER — Ambulatory Visit (INDEPENDENT_AMBULATORY_CARE_PROVIDER_SITE_OTHER): Payer: BC Managed Care – PPO | Admitting: Interventional Cardiology

## 2013-03-10 ENCOUNTER — Encounter: Payer: Self-pay | Admitting: Interventional Cardiology

## 2013-03-10 VITALS — BP 152/91 | HR 60 | Ht 60.0 in | Wt 235.0 lb

## 2013-03-10 DIAGNOSIS — R9439 Abnormal result of other cardiovascular function study: Secondary | ICD-10-CM

## 2013-03-10 DIAGNOSIS — E785 Hyperlipidemia, unspecified: Secondary | ICD-10-CM | POA: Insufficient documentation

## 2013-03-10 DIAGNOSIS — E119 Type 2 diabetes mellitus without complications: Secondary | ICD-10-CM

## 2013-03-10 DIAGNOSIS — G4733 Obstructive sleep apnea (adult) (pediatric): Secondary | ICD-10-CM

## 2013-03-10 DIAGNOSIS — I2510 Atherosclerotic heart disease of native coronary artery without angina pectoris: Secondary | ICD-10-CM

## 2013-03-10 DIAGNOSIS — I10 Essential (primary) hypertension: Secondary | ICD-10-CM

## 2013-03-10 NOTE — Progress Notes (Signed)
Patient ID: Kimberly Hardin, female   DOB: 05-13-1950, 62 y.o.   MRN: [id number]    [address] [place], [place]  [address] Phone: [phone] Fax:  [phone]  Date:  03/10/2013   ID:  Kimberly Hardin, DOB Jul 23, 1950, MRN [id number]  PCP:  Ezequiel Kayser, MD   ASSESSMENT:  1. Multivessel coronary disease, asymptomatic. The patient is a side against coronary artery bypass grafting 2 Morbid obesity, improving, with obstructive sleep apnea now being treated 3. Hypertension, controlled 4. Diabetes mellitus, with improved metabolic state  PLAN:  1. Continue current cardiac therapy. I discussed the decreased survival anticipated if we continue medical therapy versus CABG. She seems to understand this but at this point feels that she could not get through surgery 2. Cautioned to call if anginal symptoms develop 3. Active lifestyle with continued efforts at weight reduction   SUBJECTIVE: Kimberly Hardin is a 62 y.o. female who has seen [facility] consideration of coronary bypass grafting. She was seen by Dr. Laneta Simmers and recommended to have surgery but has not gone back to receive therapy. She denies angina. She is changing her lifestyle. She is trying to excise. She denies orthopnea, PND, and edema. She is now on CPAP and feels much better. Hemoglobin A1c levels are now under 8.   Wt Readings from Last 3 Encounters:  03/10/13 235 lb (106.595 kg)  12/02/12 228 lb (103.42 kg)  11/27/12 228 lb (103.42 kg)     Past Medical History  Diagnosis Date  . MI (myocardial infarction)     2 stents  . Diabetes mellitus without complication   . Hypertension   . Hyperlipidemia   . Allergic rhinitis   . Obesity   . Asthma   . Neuropathy   . Constipation   . History of kidney stones   . History of renal failure   . Anemia   . OSA (obstructive sleep apnea)     USES NASAL PRONGS  . Anxiety     PANIC ATTACKS    Current Outpatient Prescriptions  Medication Sig Dispense Refill  .  albuterol (PROVENTIL HFA;VENTOLIN HFA) 108 (90 BASE) MCG/ACT inhaler Inhale 2 puffs into the lungs every 6 (six) hours as needed for wheezing.      Marland Kitchen ALPRAZolam (XANAX) 0.25 MG tablet Take 0.125 mg by mouth daily as needed for anxiety.      Marland Kitchen aspirin 325 MG tablet Take 325 mg by mouth daily.       . carvedilol (COREG) 25 MG tablet Take 25 mg by mouth 2 (two) times daily with a meal.      . cetirizine (ZYRTEC) 10 MG tablet Take 10 mg by mouth at bedtime.      . Coenzyme Q10 (COQ10) 200 MG CAPS Take 200 mg by mouth daily.      . insulin aspart (NOVOLOG) 100 UNIT/ML injection Inject 16 Units into the skin 3 (three) times daily with meals.       . insulin glargine (LANTUS) 100 UNIT/ML injection Inject 30 Units into the skin at bedtime.       . nitroGLYCERIN (NITRODUR - DOSED IN MG/24 HR) 0.4 mg/hr Place 1 patch onto the skin daily.      . nitroGLYCERIN (NITROSTAT) 0.4 MG SL tablet Place 0.4 mg under the tongue every 5 (five) minutes as needed for chest pain.      . Omega-3 Fatty Acids (FISH OIL) 1000 MG CAPS Take 1 capsule by mouth daily.      Marland Kitchen  promethazine (PHENERGAN) 25 MG suppository Place 25 mg rectally at bedtime as needed for nausea.      . TURMERIC PO Take 800 mg by mouth 2 (two) times daily.        No current facility-administered medications for this visit.   Facility-Administered Medications Ordered in Other Visits  Medication Dose Route Frequency Provider Last Rate Last Dose  . gentamicin (GARAMYCIN) 80 mg in dextrose 5 % 50 mL IVPB  80 mg Intravenous Once Sebastian Ache, MD        Allergies:    Allergies  Allergen Reactions  . Darvon [Propoxyphene Hcl] Nausea And Vomiting and Other (See Comments)    Headaches   . Betadine [Povidone Iodine]     Hives    . Flagyl [Metronidazole] Other (See Comments)    Unknown   . Levaquin [Levofloxacin In D5w] Other (See Comments)    hallucinations  . Librium [Chlordiazepoxide] Other (See Comments)    Dizziness   . Minocycline Other (See  Comments)    Severe nausea and vomiting, severe diarrhea  . Morphine And Related Other (See Comments)    Seeing hallucinations, out of body experience-floating above her body feeling  . Prednisone Other (See Comments)    Vision disturbances, completely unfocused sight and caused some eyesight damage, after stopped taking med   . Statins Other (See Comments)    Caused muscle cramps  . Sudafed [Pseudoephedrine Hcl] Other (See Comments)    Causes High blood pressure  . Valium [Diazepam] Other (See Comments)    Hallucinations, caused tunnel vision.  Marland Kitchen Zoloft [Sertraline Hcl] Other (See Comments)    nightmares    Social History:  The patient  reports that she has never smoked. She has never used smokeless tobacco. She reports that she does not drink alcohol or use illicit drugs.   ROS:  Please see the history of present illness.     All other systems reviewed and negative.   OBJECTIVE: VS:  BP 152/91  Pulse 60  Ht 5' (1.524 m)  Wt 235 lb (106.595 kg)  BMI 45.90 kg/m2 Well nourished, well developed, in no acute distress, obese HEENT: normal Neck: JVD flat. Carotid bruit 2+  Cardiac:  normal S1, S2; RRR; no murmur Lungs:  clear to auscultation bilaterally, no wheezing, rhonchi or rales Abd: soft, nontender, no hepatomegaly Ext: Edema absent. Pulses 2+ and symmetric Skin: warm and dry Neuro:  CNs 2-12 intact, no focal abnormalities noted  EKG:  Not repeated       Signed, Darci Needle III, MD 03/10/2013 5:42 PM

## 2013-03-10 NOTE — Patient Instructions (Signed)
Your physician recommends that you continue on your current medications as directed. Please refer to the Current Medication list given to you today.  Your physician wants you to follow-up in: 6 months You will receive a reminder letter in the mail two months in advance. If you don't receive a letter, please call our office to schedule the follow-up appointment.  Continue to maintain a healthy lifestyle with diet and exercise

## 2013-04-14 ENCOUNTER — Other Ambulatory Visit: Payer: Self-pay | Admitting: Interventional Cardiology

## 2013-05-26 ENCOUNTER — Telehealth: Payer: Self-pay | Admitting: Interventional Cardiology

## 2013-05-26 NOTE — Telephone Encounter (Signed)
pt sts that pt is currently Augmentin Bid for 3 days.pt husband adv pt does need additional prophylactic.pt husband verbalized understanding.

## 2013-05-26 NOTE — Telephone Encounter (Signed)
New message         Pt is going to have a procedure done on her tooth tomorrow morning and wants to know if she needs to pre medicate. She is on augmenten 500mg  already.

## 2013-11-02 ENCOUNTER — Ambulatory Visit (INDEPENDENT_AMBULATORY_CARE_PROVIDER_SITE_OTHER): Payer: BC Managed Care – PPO | Admitting: Interventional Cardiology

## 2013-11-02 ENCOUNTER — Encounter: Payer: Self-pay | Admitting: Interventional Cardiology

## 2013-11-02 VITALS — BP 170/62 | Ht 59.0 in | Wt 243.0 lb

## 2013-11-02 DIAGNOSIS — I2510 Atherosclerotic heart disease of native coronary artery without angina pectoris: Secondary | ICD-10-CM

## 2013-11-02 NOTE — Patient Instructions (Addendum)
Your physician recommends that you continue on your current medications as directed. Please refer to the Current Medication list given to you today.  Your physician discussed the importance of regular aerobic exercise and recommended that you start or continue a regular exercise program for good health.  Your physician encouraged you to lose weight for better health.  Your physician wants you to follow-up in: 1 year with Dr. Katrinka BlazingSmith. You will receive a reminder letter in the mail two months in advance. If you don't receive a letter, please call our office to schedule the follow-up appointment.

## 2013-11-02 NOTE — Progress Notes (Signed)
Patient ID: Kimberly Hardin, female   DOB: 26-Jan-1951, 63 y.o.   MRN: [id number]    [address] [place], [place]  [address] Phone: [phone] Fax:  [phone]  Date:  11/02/2013   ID:  Kimberly Hardin, DOB 10-Jan-1951, MRN [id number]  PCP:  Ezequiel Kayser, MD   ASSESSMENT:  1. Coronary artery disease with multiple prior stents involving the LAD, circumflex, and RCA. She has had no angina and his been relatively more active. She has a moderate risk myocardial perfusion study, which he has refused further cardiac angiography to evaluate. Since the last office visit if anything her functional status has improved. 2. Hypertension, elevated today. She states other numbers are much better when she checks them at home. 3. Obesity 4. Diabetes mellitus  PLAN:  1. Weight loss 2. Aerobic exercise 3. Diabetes control 4. Low salt diet. Continue to monitor blood pressures. I would be concerned if they chronically run above 140. She will call me results in one month. 5. Clinical followup in one year   SUBJECTIVE: Kimberly Hardin is a 63 y.o. female who states that she is doing well. She has been walking in her neighborhood. She is walking with the use of a rolling 3 wheeled cane. She has not had syncope. She denies orthopnea, PND, and nitroglycerin use. There is absolutely no chest discomfort or indigestion complaint. She is accompanied by her husband. He had tests that her physical/exertional tolerance has significantly improved.   Wt Readings from Last 3 Encounters:  11/02/13 243 lb (110.224 kg)  03/10/13 235 lb (106.595 kg)  12/02/12 228 lb (103.42 kg)     Past Medical History  Diagnosis Date  . MI (myocardial infarction)     2 stents  . Diabetes mellitus without complication   . Hypertension   . Hyperlipidemia   . Allergic rhinitis   . Obesity   . Asthma   . Neuropathy   . Constipation   . History of kidney stones   . History of renal failure   . Anemia   . OSA  (obstructive sleep apnea)     USES NASAL PRONGS  . Anxiety     PANIC ATTACKS    Current Outpatient Prescriptions  Medication Sig Dispense Refill  . aspirin 325 MG tablet Take 325 mg by mouth daily.       . Bilberry, Vaccinium myrtillus, 1000 MG CAPS Take 1,000 mg by mouth daily.      . carvedilol (COREG) 25 MG tablet TAKE 1 TABLET BY MOUTH TWICE DAILY  60 tablet  6  . cetirizine (ZYRTEC) 10 MG tablet Take 10 mg by mouth at bedtime.      . Cholecalciferol (D3 DOTS) 2000 UNITS TBDP Take 2,000 Units by mouth daily.      . Coenzyme Q10 (COQ10) 200 MG CAPS Take 200 mg by mouth daily.      Marland Kitchen Hawthorn 565 MG CAPS Take 565 mg by mouth 2 (two) times daily.      . insulin aspart (NOVOLOG) 100 UNIT/ML injection Inject 16 Units into the skin 3 (three) times daily with meals.       . insulin glargine (LANTUS) 100 UNIT/ML injection Inject 40 Units into the skin at bedtime.       Marland Kitchen LORazepam (ATIVAN) 0.5 MG tablet Take 0.5 mg by mouth as needed for anxiety (1-2  tablets as needed).      . Multiple Vitamin (MULTIVITAMIN) capsule Take 1 capsule by mouth daily.      Marland Kitchen  nitroGLYCERIN (NITROSTAT) 0.4 MG SL tablet Place 0.4 mg under the tongue every 5 (five) minutes as needed for chest pain.      . Omega-3 Fatty Acids (FISH OIL) 1000 MG CAPS Take 1 capsule by mouth daily.      . Pomegranate 250 MG CAPS Take 250 mg by mouth 2 (two) times daily.      . Pyridoxine-Kelp-Lect-Vinegar (KLB6 PO) Take by mouth daily.      . TURMERIC PO Take 800 mg by mouth 2 (two) times daily.       . promethazine (PHENERGAN) 25 MG suppository Place 25 mg rectally at bedtime as needed for nausea.       No current facility-administered medications for this visit.   Facility-Administered Medications Ordered in Other Visits  Medication Dose Route Frequency Provider Last Rate Last Dose  . gentamicin (GARAMYCIN) 80 mg in dextrose 5 % 50 mL IVPB  80 mg Intravenous Once Sebastian Acheheodore Manny, MD        Allergies:    Allergies  Allergen  Reactions  . Darvon [Propoxyphene Hcl] Nausea And Vomiting and Other (See Comments)    Headaches   . Betadine [Povidone Iodine]     Hives    . Flagyl [Metronidazole] Other (See Comments)    Unknown   . Levaquin [Levofloxacin In D5w] Other (See Comments)    hallucinations  . Librium [Chlordiazepoxide] Other (See Comments)    Dizziness   . Minocycline Other (See Comments)    Severe nausea and vomiting, severe diarrhea  . Morphine And Related Other (See Comments)    Seeing hallucinations, out of body experience-floating above her body feeling  . Prednisone Other (See Comments)    Vision disturbances, completely unfocused sight and caused some eyesight damage, after stopped taking med   . Statins Other (See Comments)    Caused muscle cramps  . Sudafed [Pseudoephedrine Hcl] Other (See Comments)    Causes High blood pressure  . Valium [Diazepam] Other (See Comments)    Hallucinations, caused tunnel vision.  Marland Kitchen. Zoloft [Sertraline Hcl] Other (See Comments)    nightmares    Social History:  The patient  reports that she has never smoked. She has never used smokeless tobacco. She reports that she does not drink alcohol or use illicit drugs.   ROS:  Please see the history of present illness.   Some insomnia. Difficulty with her feet that limit aerobic activity. No neurological symptoms. No blood in urine or stool. Compliant with medical regimen.   All other systems reviewed and negative.   OBJECTIVE: VS:  BP 170/62  Ht 4\' 11"  (1.499 m)  Wt 243 lb (110.224 kg)  BMI 49.05 kg/m2 Well nourished, well developed, in no acute distress, obese HEENT: normal Neck: JVD flat. Carotid bruit absent  Cardiac:  normal S1, S2; RRR; no murmur Lungs:  clear to auscultation bilaterally, no wheezing, rhonchi or rales Abd: soft, nontender, no hepatomegaly Ext: Edema absent. Pulses 2+ bilateral Skin: warm and dry Neuro:  CNs 2-12 intact, no focal abnormalities noted  EKG:  Not performed        Signed, Darci NeedleHenry W. B. Smith III, MD 11/02/2013 10:35 AM

## 2013-12-03 ENCOUNTER — Other Ambulatory Visit: Payer: Self-pay | Admitting: Interventional Cardiology

## 2014-01-29 ENCOUNTER — Other Ambulatory Visit: Payer: Self-pay

## 2014-03-25 ENCOUNTER — Encounter (HOSPITAL_COMMUNITY): Payer: Self-pay | Admitting: Interventional Cardiology

## 2014-07-16 ENCOUNTER — Other Ambulatory Visit: Payer: Self-pay | Admitting: Interventional Cardiology

## 2014-07-27 ENCOUNTER — Encounter: Payer: Self-pay | Admitting: Internal Medicine

## 2014-07-27 NOTE — Progress Notes (Signed)
This encounter was created in error - please disregard.

## 2014-10-11 ENCOUNTER — Other Ambulatory Visit: Payer: Self-pay

## 2014-10-12 ENCOUNTER — Encounter: Payer: Self-pay | Admitting: Internal Medicine

## 2014-10-12 NOTE — Progress Notes (Signed)
This encounter was created in error - please disregard.

## 2014-11-25 ENCOUNTER — Other Ambulatory Visit: Payer: Self-pay | Admitting: Interventional Cardiology

## 2014-12-02 ENCOUNTER — Observation Stay (HOSPITAL_BASED_OUTPATIENT_CLINIC_OR_DEPARTMENT_OTHER): Payer: BC Managed Care – PPO

## 2014-12-02 ENCOUNTER — Encounter (HOSPITAL_COMMUNITY): Payer: Self-pay

## 2014-12-02 ENCOUNTER — Emergency Department (HOSPITAL_COMMUNITY): Payer: BC Managed Care – PPO

## 2014-12-02 ENCOUNTER — Observation Stay (HOSPITAL_COMMUNITY)
Admission: EM | Admit: 2014-12-02 | Discharge: 2014-12-05 | Disposition: A | Discharge status: Home / Self Care | Payer: BC Managed Care – PPO | Attending: Internal Medicine | Admitting: Internal Medicine

## 2014-12-02 DIAGNOSIS — R079 Chest pain, unspecified: Secondary | ICD-10-CM

## 2014-12-02 DIAGNOSIS — I1310 Hypertensive heart and chronic kidney disease without heart failure, with stage 1 through stage 4 chronic kidney disease, or unspecified chronic kidney disease: Secondary | ICD-10-CM | POA: Insufficient documentation

## 2014-12-02 DIAGNOSIS — I130 Hypertensive heart and chronic kidney disease with heart failure and stage 1 through stage 4 chronic kidney disease, or unspecified chronic kidney disease: Secondary | ICD-10-CM | POA: Diagnosis not present

## 2014-12-02 DIAGNOSIS — Z955 Presence of coronary angioplasty implant and graft: Secondary | ICD-10-CM | POA: Insufficient documentation

## 2014-12-02 DIAGNOSIS — E1121 Type 2 diabetes mellitus with diabetic nephropathy: Secondary | ICD-10-CM | POA: Diagnosis not present

## 2014-12-02 DIAGNOSIS — E1122 Type 2 diabetes mellitus with diabetic chronic kidney disease: Secondary | ICD-10-CM | POA: Insufficient documentation

## 2014-12-02 DIAGNOSIS — I5033 Acute on chronic diastolic (congestive) heart failure: Secondary | ICD-10-CM | POA: Diagnosis not present

## 2014-12-02 DIAGNOSIS — Z7982 Long term (current) use of aspirin: Secondary | ICD-10-CM | POA: Diagnosis not present

## 2014-12-02 DIAGNOSIS — E119 Type 2 diabetes mellitus without complications: Secondary | ICD-10-CM

## 2014-12-02 DIAGNOSIS — E1165 Type 2 diabetes mellitus with hyperglycemia: Secondary | ICD-10-CM | POA: Diagnosis present

## 2014-12-02 DIAGNOSIS — D509 Iron deficiency anemia, unspecified: Secondary | ICD-10-CM | POA: Diagnosis present

## 2014-12-02 DIAGNOSIS — G4733 Obstructive sleep apnea (adult) (pediatric): Secondary | ICD-10-CM | POA: Diagnosis not present

## 2014-12-02 DIAGNOSIS — I252 Old myocardial infarction: Secondary | ICD-10-CM | POA: Diagnosis not present

## 2014-12-02 DIAGNOSIS — I5031 Acute diastolic (congestive) heart failure: Secondary | ICD-10-CM | POA: Diagnosis present

## 2014-12-02 DIAGNOSIS — I10 Essential (primary) hypertension: Secondary | ICD-10-CM | POA: Diagnosis not present

## 2014-12-02 DIAGNOSIS — I119 Hypertensive heart disease without heart failure: Secondary | ICD-10-CM

## 2014-12-02 DIAGNOSIS — E785 Hyperlipidemia, unspecified: Secondary | ICD-10-CM | POA: Diagnosis not present

## 2014-12-02 DIAGNOSIS — I25119 Atherosclerotic heart disease of native coronary artery with unspecified angina pectoris: Principal | ICD-10-CM | POA: Diagnosis present

## 2014-12-02 DIAGNOSIS — Z794 Long term (current) use of insulin: Secondary | ICD-10-CM | POA: Diagnosis not present

## 2014-12-02 DIAGNOSIS — N182 Chronic kidney disease, stage 2 (mild): Secondary | ICD-10-CM | POA: Diagnosis present

## 2014-12-02 DIAGNOSIS — I208 Other forms of angina pectoris: Secondary | ICD-10-CM | POA: Diagnosis not present

## 2014-12-02 DIAGNOSIS — I5032 Chronic diastolic (congestive) heart failure: Secondary | ICD-10-CM | POA: Insufficient documentation

## 2014-12-02 DIAGNOSIS — I209 Angina pectoris, unspecified: Secondary | ICD-10-CM | POA: Diagnosis present

## 2014-12-02 LAB — CBC
HEMATOCRIT: 35.4 % — AB (ref 36.0–46.0)
Hemoglobin: 11.7 g/dL — ABNORMAL LOW (ref 12.0–15.0)
MCH: 29.6 pg (ref 26.0–34.0)
MCHC: 33.1 g/dL (ref 30.0–36.0)
MCV: 89.6 fL (ref 78.0–100.0)
Platelets: 189 10*3/uL (ref 150–400)
RBC: 3.95 MIL/uL (ref 3.87–5.11)
RDW: 13.2 % (ref 11.5–15.5)
WBC: 6.6 10*3/uL (ref 4.0–10.5)

## 2014-12-02 LAB — BASIC METABOLIC PANEL
Anion gap: 6 (ref 5–15)
BUN: 31 mg/dL — AB (ref 6–20)
CHLORIDE: 110 mmol/L (ref 101–111)
CO2: 24 mmol/L (ref 22–32)
Calcium: 9.5 mg/dL (ref 8.9–10.3)
Creatinine, Ser: 1.04 mg/dL — ABNORMAL HIGH (ref 0.44–1.00)
GFR calc Af Amer: >60 mL/min (ref >60)
GFR calc non Af Amer: 56 mL/min — ABNORMAL LOW (ref >60)
GLUCOSE: 208 mg/dL — AB (ref 65–99)
POTASSIUM: 4.3 mmol/L (ref 3.5–5.1)
Sodium: 140 mmol/L (ref 135–145)

## 2014-12-02 LAB — CBG MONITORING, ED
GLUCOSE-CAPILLARY: 188 mg/dL — AB (ref 65–99)
Glucose-Capillary: 256 mg/dL — ABNORMAL HIGH (ref 65–99)

## 2014-12-02 LAB — I-STAT TROPONIN, ED: Troponin i, poc: 0 ng/mL (ref 0.00–0.08)

## 2014-12-02 LAB — GLUCOSE, CAPILLARY
Glucose-Capillary: 183 mg/dL — ABNORMAL HIGH (ref 65–99)
Glucose-Capillary: 138 mg/dL — ABNORMAL HIGH (ref 65–99)

## 2014-12-02 LAB — TROPONIN I
Troponin I: <0.03 ng/mL (ref <0.031)
Troponin I: 0.03 ng/mL (ref <0.031)

## 2014-12-02 LAB — BRAIN NATRIURETIC PEPTIDE: B NATRIURETIC PEPTIDE 5: 195.4 pg/mL — AB (ref 0.0–100.0)

## 2014-12-02 MED ORDER — GI COCKTAIL ~~LOC~~
30.0000 mL | Freq: Four times a day (QID) | ORAL | Status: DC | PRN
  Filled 2014-12-02: qty 30

## 2014-12-02 MED ORDER — CARVEDILOL 25 MG PO TABS
25.0000 mg | ORAL_TABLET | Freq: Two times a day (BID) | ORAL | Status: DC
  Administered 2014-12-02 – 2014-12-04 (×4): 25 mg via ORAL
  Filled 2014-12-02 (×9): qty 1

## 2014-12-02 MED ORDER — LORAZEPAM 2 MG/ML IJ SOLN
0.5000 mg | Freq: Once | INTRAMUSCULAR | Status: AC
  Administered 2014-12-02: 0.5 mg via INTRAVENOUS
  Filled 2014-12-02: qty 1

## 2014-12-02 MED ORDER — ENOXAPARIN SODIUM 40 MG/0.4ML ~~LOC~~ SOLN
40.0000 mg | SUBCUTANEOUS | Status: DC
  Administered 2014-12-02 – 2014-12-04 (×3): 40 mg via SUBCUTANEOUS
  Filled 2014-12-02 (×4): qty 0.4

## 2014-12-02 MED ORDER — INSULIN ASPART 100 UNIT/ML ~~LOC~~ SOLN
16.0000 [IU] | Freq: Three times a day (TID) | SUBCUTANEOUS | Status: DC
  Administered 2014-12-02 – 2014-12-05 (×9): 16 [IU] via SUBCUTANEOUS
  Filled 2014-12-02: qty 1

## 2014-12-02 MED ORDER — INSULIN ASPART 100 UNIT/ML ~~LOC~~ SOLN: 0.0000 [IU] | Freq: Every day | SUBCUTANEOUS | Status: DC

## 2014-12-02 MED ORDER — ASPIRIN 325 MG PO TABS
325.0000 mg | ORAL_TABLET | Freq: Every day | ORAL | Status: DC
  Administered 2014-12-03 – 2014-12-04 (×2): 325 mg via ORAL
  Filled 2014-12-02 (×3): qty 1

## 2014-12-02 MED ORDER — INSULIN ASPART 100 UNIT/ML ~~LOC~~ SOLN
0.0000 [IU] | Freq: Three times a day (TID) | SUBCUTANEOUS | Status: DC
  Administered 2014-12-02: 4 [IU] via SUBCUTANEOUS
  Administered 2014-12-02: 11 [IU] via SUBCUTANEOUS
  Administered 2014-12-03 – 2014-12-05 (×7): 4 [IU] via SUBCUTANEOUS
  Filled 2014-12-02: qty 1

## 2014-12-02 MED ORDER — INSULIN GLARGINE 100 UNIT/ML ~~LOC~~ SOLN
26.0000 [IU] | Freq: Two times a day (BID) | SUBCUTANEOUS | Status: DC
  Administered 2014-12-02 – 2014-12-04 (×6): 26 [IU] via SUBCUTANEOUS
  Filled 2014-12-02 (×8): qty 0.26

## 2014-12-02 MED ORDER — ACETAMINOPHEN 325 MG PO TABS: 650.0000 mg | ORAL_TABLET | ORAL | Status: DC | PRN

## 2014-12-02 MED ORDER — ADULT MULTIVITAMIN W/MINERALS CH
1.0000 | ORAL_TABLET | Freq: Every day | ORAL | Status: DC
  Administered 2014-12-03 – 2014-12-04 (×2): 1 via ORAL
  Filled 2014-12-02 (×3): qty 1

## 2014-12-02 MED ORDER — FUROSEMIDE 10 MG/ML IJ SOLN
20.0000 mg | Freq: Two times a day (BID) | INTRAMUSCULAR | Status: DC
  Administered 2014-12-02 – 2014-12-03 (×3): 20 mg via INTRAVENOUS
  Filled 2014-12-02 (×4): qty 2

## 2014-12-02 MED ORDER — ONDANSETRON HCL 4 MG/2ML IJ SOLN: 4.0000 mg | Freq: Four times a day (QID) | INTRAMUSCULAR | Status: DC | PRN

## 2014-12-02 MED ORDER — OMEGA-3-ACID ETHYL ESTERS 1 G PO CAPS
1.0000 g | ORAL_CAPSULE | Freq: Two times a day (BID) | ORAL | Status: DC
  Administered 2014-12-02 – 2014-12-04 (×4): 1 g via ORAL
  Filled 2014-12-02 (×10): qty 1

## 2014-12-02 NOTE — ED Notes (Addendum)
Per EMS - Pt helped move lift chair into home yesterday. Pt was asleep in chair (normally sleeps in chair because pt has hip pain when sleeping flat). Pt woke up feeling panicked and couldn't catch breath - CPAP had been off all night. Calmed down, woke again from nightmare and c/o panicked feeling and pressure across top of chest, tingle/numbness in left shoulder. Hx anxiety. Hx MI - states pain did not feel like this. Pt received injection in left leg yesterday b/c she cannot have statins and always results in pain radiating from left leg to left shoulder in following days.  Given  aspirin and 2 nitro w/ relief.

## 2014-12-02 NOTE — Consult Note (Signed)
Reason for Consult: Chest pain  Requesting Physician: Vanessa Barbara  Cardiologist: Mendel Ryder  HPI: This is a 64 y.o. female with a past medical history significant for CAD, multivessel CAD with previous revascularization with stents in all 3 major coronary territories (CABG recommended, patient declined and prefers medical therapy), DM, morbid obesity, HTN, OSA presents with atypical chest discomfort (pins and needles) radiating towards left arm. In addition she was breathing very fast and hard in blended on issues with her CPAP mask. This happened at 3 AM and she had to sit up to breathe and after laying back down it repeated itself at 4 AM. She sleeps partially elevated anyway secondary to back problems. She has noticed that if she tries to lean back horizontally she feels chest tightness and smothering.  Early biomarkers show low risk. Her electrocardiogram is abnormal, but unchanged from previous tracings (old septal Q waves, anterolateral prominent T-wave inversion in a pattern suggestive of either ischemia or secondary repolarization changes from left ventricular hypertrophy). Her chest x-ray is normal.  She last saw Dr. Katrinka Blazing roughly a year ago when if anything her functional capacity had improved and she was having less exertional symptoms.  She has been working hard on weight loss and reports that she is substantially leaner than a year ago, but over the last couple of weeks has noticed a 5 pound weight gain. She and her husband have been eating on the road a lot recently, she has had a craving for salt and has eaten popcorn and onion rings recently. Usually she is more disciplined with sodium intake. She she keeps a detailed daily record of her blood pressure, which is most frequently greater than 140/80, commonly in the high 150s/high 80s.  Cath August 2014: ANGIOGRAPHIC DATA: The left main coronary artery is distal 30% narrowing.  The left anterior descending artery is a long  segment of proximal stents is noted. Eccentric 80% focal ISR is noted in the proximal stented region. Distal vessel is relatively small..  The left circumflex artery is mid stent is widely patent. Subtotal occlusion distal to the stent with 95+ percent stenosis and competitive flow via collaterals from the right coronary..  The right coronary artery is dominant and supplies collaterals to the circumflex. PDA contains 50% narrowing.Marland Kitchen  LEFT VENTRICULOGRAM: Left ventricular angiogram was done in the 30 RAO projection and revealed normal left ventricular wall motion and systolic function with an estimated ejection fraction of 50%.   IMPRESSIONS: 1. Proximal focal ISR in LAD stent. Subtotal occlusion mid to distal circumflex beyond patent stents in the mid vessel. Left to right collaterals from the right coronary to the distal circumflex.  2. Widely patent RCA collateralized in the circumflex  3. Overall normal LV function   RECOMMENDATION: Clinical management decisions include continued medical therapy versus consideration of coronary artery grafting in this diabetic who has had proximal LAD restenosis x2 and now has progression of disease in the circumflex. Will discuss with colleagues including one of the cardiac surgeons.  PMHx:  Past Medical History  Diagnosis Date  . MI (myocardial infarction)     2 stents  . Diabetes mellitus without complication   . Hypertension   . Hyperlipidemia   . Allergic rhinitis   . Obesity   . Asthma   . Neuropathy   . Constipation   . History of kidney stones   . History of renal failure   . Anemia   . OSA (obstructive sleep apnea)  USES NASAL PRONGS  . Anxiety     PANIC ATTACKS   Past Surgical History  Procedure Laterality Date  . Eye surgery  3 years ago    laser surgery  . Cystoscopy w/ ureteral stent placement Bilateral 08/12/2012    Procedure: CYSTOSCOPY WITH RETROGRADE PYELOGRAM/URETERAL STENT PLACEMENT;  Surgeon: Sebastian Ache,  MD;  Location: [facility];  Service: Urology;  Laterality: Bilateral;  . Coronary angioplasty  2011/ 2012    CARDIAC STENTS X4  . Cystoscopy with retrograde pyelogram, ureteroscopy and stent placement Bilateral 10/15/2012    Procedure: BILATERAL URETERSCOPIC STONE MANIPULATION, LEFT URETERAL STONE BASKETRY, BILATERAL RETROGRADES AND STENT EXCHANGECHANGE;  Surgeon: Sebastian Ache, MD;  Location: [facility];  Service: Urology;  Laterality: Bilateral;  . Holmium laser application Bilateral 10/15/2012    Procedure: HOLMIUM LASER APPLICATION;  Surgeon: Sebastian Ache, MD;  Location: [facility];  Service: Urology;  Laterality: Bilateral;  . Left heart catheterization with coronary angiogram N/A 11/27/2012    Procedure: LEFT HEART CATHETERIZATION WITH CORONARY ANGIOGRAM;  Surgeon: Lesleigh Noe, MD;  Location: [facility] CATH [facility];  Service: Cardiovascular;  Laterality: N/A;    FAMHx: Family History  Problem Relation Age of Onset  . Diabetes Father   . Heart failure Father   . Breast cancer Mother   . Diabetes Sister   . COPD Sister   . Stomach cancer Maternal Grandfather   . Diabetes Paternal Grandmother   . Diabetes Paternal Grandfather     SOCHx:  reports that she has never smoked. She has never used smokeless tobacco. She reports that she does not drink alcohol or use illicit drugs.  ALLERGIES: Allergies  Allergen Reactions  . Darvon [Propoxyphene Hcl] Nausea And Vomiting and Other (See Comments)    Headaches   . Betadine [Povidone Iodine]     Hives    . Flagyl [Metronidazole] Other (See Comments)    Unknown   . Levaquin [Levofloxacin In D5w] Other (See Comments)    hallucinations  . Librium [Chlordiazepoxide] Other (See Comments)    Dizziness   . Minocycline Other (See Comments)    Severe nausea and vomiting, severe diarrhea  . Morphine And Related Other (See Comments)    Seeing hallucinations, out of body experience-floating above her body feeling  . Prednisone Other (See Comments)    Vision  disturbances, completely unfocused sight and caused some eyesight damage, after stopped taking med   . Statins Other (See Comments)    Caused muscle cramps  . Sudafed [Pseudoephedrine Hcl] Other (See Comments)    Causes High blood pressure  . Valium [Diazepam] Other (See Comments)    Hallucinations, caused tunnel vision.  Marland Kitchen Zoloft [Sertraline Hcl] Other (See Comments)    nightmares    ROS: Constitutional: Negative for fever, chills and diaphoresis.  Respiratory: Negative for cough, shortness of breath and wheezing.  Cardiovascular: Positive for chest pain. Negative for orthopnea, claudication, leg swelling and syncope.  Gastrointestinal: Negative for heartburn, nausea, vomiting, abdominal pain, diarrhea and constipation.  Genitourinary: Negative for dysuria and hematuria.  Musculoskeletal: Negative for myalgias, back pain and arthralgias.  Skin: Negative for color change.  Allergic/Immunologic: no rashes/allergies Neurological: Positive for light-headedness. Negative for weakness and numbness.   +tingling across chest and L shoulder  Psychiatric/Behavioral: Negative for confusion. The patient is nervous/anxious.   10 Systems reviewed and are negative for acute change except as noted in the HPI.   HOME MEDICATIONS: Current Facility-Administered Medications on File Prior to Encounter  Medication Dose Route Frequency Provider  Last Rate Last Dose  . gentamicin (GARAMYCIN) 80 mg in dextrose 5 % 50 mL IVPB  80 mg Intravenous Once Sebastian Ache, MD       Current Outpatient Prescriptions on File Prior to Encounter  Medication Sig Dispense Refill  . aspirin 325 MG tablet Take 325 mg by mouth daily.     . Bilberry, Vaccinium myrtillus, 1000 MG CAPS Take 1,000 mg by mouth daily.    . carvedilol (COREG) 25 MG tablet TAKE 1 TABLET BY MOUTH TWICE DAILY 60 tablet 1  . Cholecalciferol (D3 DOTS) 2000 UNITS TBDP Take 2,000 Units by mouth daily.    . Coenzyme Q10 (COQ10) 200 MG CAPS Take  200 mg by mouth daily.    Marland Kitchen Hawthorn 565 MG CAPS Take 565 mg by mouth 2 (two) times daily.    . insulin aspart (NOVOLOG) 100 UNIT/ML injection Inject 16 Units into the skin 3 (three) times daily with meals.     . insulin glargine (LANTUS) 100 UNIT/ML injection Inject 40 Units into the skin 2 (two) times daily.     . Multiple Vitamin (MULTIVITAMIN) capsule Take 1 capsule by mouth daily.    . nitroGLYCERIN (NITROSTAT) 0.4 MG SL tablet Place 0.4 mg under the tongue every 5 (five) minutes as needed for chest pain.    . Omega-3 Fatty Acids (FISH OIL) 1000 MG CAPS Take 1 capsule by mouth daily.    . Pomegranate 250 MG CAPS Take 250 mg by mouth 2 (two) times daily.    . Pyridoxine-Kelp-Lect-Vinegar (KLB6 PO) Take 1 tablet by mouth daily.     . TURMERIC PO Take 800 mg by mouth 2 (two) times daily.     REPATHA started last month   VITALS: Blood pressure 157/61, pulse 57, temperature 98.1 F (36.7 C), resp. rate 16, SpO2 97 %.  PHYSICAL EXAM:  General: Alert, oriented x3, no distress, morbid obesity limits her exam Head: no evidence of trauma, PERRL, EOMI, no exophtalmos or lid lag, no myxedema, no xanthelasma; normal ears, nose and oropharynx Neck: Normal jugular venous pulsations and no hepatojugular reflux; brisk carotid pulses without delay and no carotid bruits Chest: clear to auscultation, no signs of consolidation by percussion or palpation, normal fremitus, symmetrical and full respiratory excursions Cardiovascular: Unable to identify the apical impulse, regular rhythm, normal first heart sound and normal second heart sound, no rubs or gallops, no murmur Abdomen: no tenderness or distention, no masses by palpation, no abnormal pulsatility or arterial bruits, normal bowel sounds, no hepatosplenomegaly Extremities: no clubbing, cyanosis;  no edema; 2+ radial, ulnar and brachial pulses bilaterally; 2+ right femoral, posterior tibial and dorsalis pedis pulses; 2+ left femoral, posterior tibial and  dorsalis pedis pulses; no subclavian or femoral bruits Neurological: grossly nonfocal   LABS  CBC  Recent Labs  12/02/14 0628  WBC 6.6  HGB 11.7*  HCT 35.4*  MCV 89.6  PLT 189   Basic Metabolic Panel  Recent Labs  12/02/14 0628  NA 140  K 4.3  CL 110  CO2 24  GLUCOSE 208*  BUN 31*  CREATININE 1.04*  CALCIUM 9.5    IMAGING: Dg Chest 2 View  12/02/2014   CLINICAL DATA:  Shortness of breath beginning last night.  EXAM: CHEST  2 VIEW  COMPARISON:  PA and lateral chest 10/09/2012.  FINDINGS: The lungs are clear. Heart size is normal. No pneumothorax or pleural effusion.  IMPRESSION: No acute disease.   Electronically Signed   By: Drusilla Kanner M.D.  On: 12/02/2014 07:25    ECG: Normal sinus rhythm, septal Q waves V1 and V2, downsloping ST depression and T-wave inversion in multiple leads in the anterolateral distribution. Not much change from previous tracings.  TELEMETRY:  sinus rhythm  IMPRESSION: 1. Acute diastolic heart failure - I think Mrs. Deike is describing paroxysmal nocturnal dyspnea and even more long-standing orthopnea.  2. Atypical chest discomfort - Her chest/left shoulder pain is atypical and very different from the retrosternal tight "icy" feeling that she had with her previous myocardial infarction. So far no high-risk features on ECG or cardiac enzymes. I don't think her symptoms represent an acute coronary syndrome 3. Multivessel CAD with high-grade stenosis in the proximal LAD artery - she is clearly pleased with how she has done with medical therapy so far, even though it has been explained to her that mortalities increase in diabetic patients with proximal LAD disease 4. Diabetes mellitus, type II with recent marked improvement in glycemic control, complicated by retinopathy, history of acute renal failure in the past, currently borderline abnormalities in BUN/creatinine 5. Hyperlipidemia on Repatha (recently started), statin  intolerant. 6. Insufficiently controlled essential hypertension  RECOMMENDATION: 1. Echocardiogram 2. Diuretics, use judiciously to avoid acute renal injury in view of her history of diabetes mellitus and previous acute renal insufficiency. Target weight loss of 5-10 pounds over the next 2-3 days 3. Continue to check cardiac enzymes, but doubt that this is an acute athero-thrombotic coronary event 4. Add angiotensin receptor blocker for better blood pressure control, renal protection and heart failure. Monitor renal function and potassium  Time Spent Directly with Patient: 60 minutes  Thurmon Fair, MD, [facility] [phone] office [phone] pager   12/02/2014, 8:20 AM

## 2014-12-02 NOTE — ED Notes (Signed)
Notified for lovenox and other medications, again.

## 2014-12-02 NOTE — ED Notes (Signed)
Patient did ambulate to restroom and tolerated well.

## 2014-12-02 NOTE — Progress Notes (Addendum)
H&P Addendum  I personally saw and evaluated Mrs. Kimberly Hardin with Junious Silk FNP on 12/02/2014 and agree with her findings. Patient is a 64 year old female with an established history of coronary artery disease, status post percutaneous intervention 2, hypertension, diabetes mellitus, presenting to the emergency department with complaints of chest pain that started at approximately 4:00 this morning. She described her chest pain as pressure-like located in the left side of her chest which radiated to her left shoulder. Patient receiving aspirin and nitroglycerin after which symptoms resolved. During my encounter she was chest pain-free. Workup in the emergency department included an EKG which revealed T-wave inversions in lateral leads that were present on previous EKG from 2014. Troponin was negative. Cardiology consulted. On physical examination she was awake and alert, having a regular rate and rhythm, lungs clear, did have trace edema to lower extremities bilaterally. Patient will be admitted for chest pain rule out. Follow-up on cardiology's recommendations.

## 2014-12-02 NOTE — Progress Notes (Signed)
  Echocardiogram 2D Echocardiogram has been performed.  Kimberly Hardin 12/02/2014, 4:47 PM

## 2014-12-02 NOTE — ED Provider Notes (Signed)
CSN: [id number]     Arrival date & time 12/02/14  0601 History   First MD Initiated Contact with Patient 12/02/14 0615     No chief complaint on file.    (Consider location/radiation/quality/duration/timing/severity/associated sxs/prior Treatment) HPI Comments: Kimberly Hardin is a 64 y.o. female with a PMHx of MI s/p 2 stents, DM2, HTN, HLD, asthma, nephrolithiasis, and OSA on CPAP (didn't wear last night), who presents to the ED with complaints of chest pain that began around 4 AM when she woke up from a nightmare. She reports this pain was 7/10 constant "pins and needles" and pressure across her chest anteriorly, radiating down to the left shoulder, with no known aggravating factors, and relieved with aspirin and nitroglycerin given prior to arrival. Currently CP free. Associated symptoms include lightheadedness and some anxiety. She reports that this does not feel like her normal anxiety attacks, nor does feel like her prior MI. She denies any fevers, chills, shortness of breath, cough, wheezing, diaphoresis, syncope, leg swelling, recent travel/surgery/immobilization, abdominal pain, nausea vomiting, diarrhea, constipation, dysuria, hematuria, numbness, or weakness. She is a nonsmoker. Positive family history for CABG in her father.  Patient is a 64 y.o. female presenting with chest pain. The history is provided by the patient. No language interpreter was used.  Chest Pain Pain location:  Substernal area Pain quality comment:  Tingling "pins and needles" Pain radiates to:  L shoulder Pain radiates to the back: no   Pain severity:  Moderate Onset quality:  Sudden Duration:  2 hours Timing:  Constant Progression:  Resolved Chronicity:  Recurrent Context: at rest   Relieved by:  Nitroglycerin and aspirin Worsened by:  Nothing tried Ineffective treatments:  None tried Associated symptoms: anxiety   Associated symptoms: no abdominal pain, no back pain, no claudication, no cough, no  diaphoresis, no fever, no heartburn, no lower extremity edema, no nausea, no numbness, no orthopnea, no shortness of breath, no syncope, not vomiting and no weakness   Risk factors: coronary artery disease, diabetes mellitus, high cholesterol, hypertension and obesity   Risk factors: no immobilization, no prior DVT/PE, no smoking and no surgery     Past Medical History  Diagnosis Date  . MI (myocardial infarction)     2 stents  . Diabetes mellitus without complication   . Hypertension   . Hyperlipidemia   . Allergic rhinitis   . Obesity   . Asthma   . Neuropathy   . Constipation   . History of kidney stones   . History of renal failure   . Anemia   . OSA (obstructive sleep apnea)     USES NASAL PRONGS  . Anxiety     PANIC ATTACKS   Past Surgical History  Procedure Laterality Date  . Eye surgery  3 years ago    laser surgery  . Cystoscopy w/ ureteral stent placement Bilateral 08/12/2012    Procedure: CYSTOSCOPY WITH RETROGRADE PYELOGRAM/URETERAL STENT PLACEMENT;  Surgeon: Sebastian Ache, MD;  Location: [facility];  Service: Urology;  Laterality: Bilateral;  . Coronary angioplasty  2011/ 2012    CARDIAC STENTS X4  . Cystoscopy with retrograde pyelogram, ureteroscopy and stent placement Bilateral 10/15/2012    Procedure: BILATERAL URETERSCOPIC STONE MANIPULATION, LEFT URETERAL STONE BASKETRY, BILATERAL RETROGRADES AND STENT EXCHANGECHANGE;  Surgeon: Sebastian Ache, MD;  Location: [facility];  Service: Urology;  Laterality: Bilateral;  . Holmium laser application Bilateral 10/15/2012    Procedure: HOLMIUM LASER APPLICATION;  Surgeon: Sebastian Ache, MD;  Location: [facility];  Service:  Urology;  Laterality: Bilateral;  . Left heart catheterization with coronary angiogram N/A 11/27/2012    Procedure: LEFT HEART CATHETERIZATION WITH CORONARY ANGIOGRAM;  Surgeon: Lesleigh Noe, MD;  Location: [facility] CATH LAB;  Service: Cardiovascular;  Laterality: N/A;   Family History  Problem Relation Age of Onset   . Diabetes Father   . Heart failure Father   . Breast cancer Mother   . Diabetes Sister   . COPD Sister   . Stomach cancer Maternal Grandfather   . Diabetes Paternal Grandmother   . Diabetes Paternal Grandfather    Social History  Substance Use Topics  . Smoking status: Never Smoker   . Smokeless tobacco: Never Used  . Alcohol Use: No   OB History    No data available     Review of Systems  Constitutional: Negative for fever, chills and diaphoresis.  Respiratory: Negative for cough, shortness of breath and wheezing.   Cardiovascular: Positive for chest pain. Negative for orthopnea, claudication, leg swelling and syncope.  Gastrointestinal: Negative for heartburn, nausea, vomiting, abdominal pain, diarrhea and constipation.  Genitourinary: Negative for dysuria and hematuria.  Musculoskeletal: Negative for myalgias, back pain and arthralgias.  Skin: Negative for color change.  Allergic/Immunologic: Positive for immunocompromised state.  Neurological: Positive for light-headedness. Negative for weakness and numbness.       +tingling across chest and L shoulder  Psychiatric/Behavioral: Negative for confusion. The patient is nervous/anxious.    10 Systems reviewed and are negative for acute change except as noted in the HPI.    Allergies  Darvon; Betadine; Flagyl; Levaquin; Librium; Minocycline; Morphine and related; Prednisone; Statins; Sudafed; Valium; and Zoloft  Home Medications   Prior to Admission medications   Medication Sig Start Date End Date Taking? Authorizing Provider  aspirin 325 MG tablet Take 325 mg by mouth daily.     Historical Provider, MD  Bilberry, Vaccinium myrtillus, 1000 MG CAPS Take 1,000 mg by mouth daily.    Historical Provider, MD  carvedilol (COREG) 25 MG tablet TAKE 1 TABLET BY MOUTH TWICE DAILY 11/25/14   Lyn Records, MD  cetirizine (ZYRTEC) 10 MG tablet Take 10 mg by mouth at bedtime.    Historical Provider, MD  Cholecalciferol (D3 DOTS) 2000  UNITS TBDP Take 2,000 Units by mouth daily.    Historical Provider, MD  Coenzyme Q10 (COQ10) 200 MG CAPS Take 200 mg by mouth daily.    Historical Provider, MD  Hawthorn 565 MG CAPS Take 565 mg by mouth 2 (two) times daily.    Historical Provider, MD  insulin aspart (NOVOLOG) 100 UNIT/ML injection Inject 16 Units into the skin 3 (three) times daily with meals.     Historical Provider, MD  insulin glargine (LANTUS) 100 UNIT/ML injection Inject 40 Units into the skin at bedtime.     Historical Provider, MD  LORazepam (ATIVAN) 0.5 MG tablet Take 0.5 mg by mouth as needed for anxiety (1-2  tablets as needed).    Historical Provider, MD  Multiple Vitamin (MULTIVITAMIN) capsule Take 1 capsule by mouth daily.    Historical Provider, MD  nitroGLYCERIN (NITROSTAT) 0.4 MG SL tablet Place 0.4 mg under the tongue every 5 (five) minutes as needed for chest pain.    Historical Provider, MD  Omega-3 Fatty Acids (FISH OIL) 1000 MG CAPS Take 1 capsule by mouth daily.    Historical Provider, MD  Pomegranate 250 MG CAPS Take 250 mg by mouth 2 (two) times daily.    Historical Provider, MD  promethazine (PHENERGAN) 25 MG suppository Place 25 mg rectally at bedtime as needed for nausea.    Historical Provider, MD  Pyridoxine-Kelp-Lect-Vinegar (KLB6 PO) Take by mouth daily.    Historical Provider, MD  TURMERIC PO Take 800 mg by mouth 2 (two) times daily.     Historical Provider, MD   BP 170/81 mmHg  Pulse 60  Temp(Src) 98.1 F (36.7 C)  Resp 14  SpO2 98% Physical Exam  Constitutional: She is oriented to person, place, and time. Vital signs are normal. She appears well-developed and well-nourished.  Non-toxic appearance. No distress.  Afebrile, nontoxic, NAD  HENT:  Head: Normocephalic and atraumatic.  Mouth/Throat: Oropharynx is clear and moist and mucous membranes are normal.  Eyes: Conjunctivae and EOM are normal. Right eye exhibits no discharge. Left eye exhibits no discharge.  Neck: Normal range of motion.  Neck supple.  Cardiovascular: Normal rate, regular rhythm, normal heart sounds and intact distal pulses.  Exam reveals no gallop and no friction rub.   No murmur heard. RRR, nl s1/s2, no m/r/g, distal pulses intact, trace b/l pedal edema   Pulmonary/Chest: Effort normal and breath sounds normal. No respiratory distress. She has no decreased breath sounds. She has no wheezes. She has no rhonchi. She has no rales. She exhibits no tenderness, no crepitus and no deformity.  CTAB in all lung fields, no w/r/r, no hypoxia or increased WOB, speaking in full sentences, SpO2 98% on RA No chest wall tenderness, crepitus, or deformity  Abdominal: Soft. Normal appearance and bowel sounds are normal. She exhibits no distension. There is no tenderness. There is no rigidity, no rebound, no guarding, no CVA tenderness, no tenderness at McBurney's point and negative Murphy's sign.  Musculoskeletal: Normal range of motion.  MAE x4 Strength and sensation grossly intact Distal pulses intact Trace pedal edema bilaterally, neg homan's  Neurological: She is alert and oriented to person, place, and time. She has normal strength. No sensory deficit.  Skin: Skin is warm, dry and intact. No rash noted.  Psychiatric: She has a normal mood and affect.  Nursing note and vitals reviewed.   ED Course  Procedures (including critical care time) Labs Review Labs Reviewed  BASIC METABOLIC PANEL - Abnormal; Notable for the following:    Glucose, Bld 208 (*)    BUN 31 (*)    Creatinine, Ser 1.04 (*)    GFR calc non Af Amer 56 (*)    All other components within normal limits  CBC - Abnormal; Notable for the following:    Hemoglobin 11.7 (*)    HCT 35.4 (*)    All other components within normal limits  TROPONIN I  TROPONIN I  I-STAT TROPOININ, ED    Imaging Review Dg Chest 2 View  12/02/2014   CLINICAL DATA:  Shortness of breath beginning last night.  EXAM: CHEST  2 VIEW  COMPARISON:  PA and lateral chest 10/09/2012.   FINDINGS: The lungs are clear. Heart size is normal. No pneumothorax or pleural effusion.  IMPRESSION: No acute disease.   Electronically Signed   By: Drusilla Kanner M.D.   On: 12/02/2014 07:25   I have personally reviewed and evaluated these images and lab results as part of my medical decision-making.  L HEART CATH 11/27/12:  IMPRESSIONS: 1. Proximal focal ISR in LAD stent. Subtotal occlusion mid to distal circumflex beyond patent stents in the mid vessel. Left to right collaterals from the right coronary to the distal circumflex. 2. Widely patent RCA collateralized in  the circumflex 3. Overall normal LV function   EKG Interpretation   Date/Time:  Thursday December 02 2014 06:07:55 EDT Ventricular Rate:  60 PR Interval:  202 QRS Duration: 86 QT Interval:  457 QTC Calculation: 457 R Axis:   51 Text Interpretation:  Sinus rhythm Probable anterior infarct, age  indeterminate Lateral leads are also involved Baseline wander in lead(s)  II III aVF No significant change since 2014 Confirmed by WARD,  DO,  KRISTEN [phone]) on 12/02/2014 6:12:07 AM      MDM   Final diagnoses:  Chest pain, unspecified chest pain type  Hyperglycemia due to type 2 diabetes mellitus    64 y.o. female here with CP, lightheadedness, tingling in L shoulder/across chest that began suddenly when she woke up from a nightmare. States this is different than regular anxiety attacks, and different than what she experienced when she had a heart attack. Will check labs, EKG, CXR, and give small dose of ativan. Pt has received ASA and NTG with significant relief, she just feels slightly anxious. Will reassess shortly. Will likely admit for ACS r/o.  7:12 AM EKG with some T wave inversions inferiorly, unchanged from prior. BMP with mildly elevated BUN/Cr near baseline. Gluc 208 without anion gap. CBC with baseline anemia. Trop neg. CXR pending but appears unchanged from prior. Will consult for medical admission.  7:35  AM Junious Silk of [facility] returning page, will admit but would like me to call cardiology as well. Of note, CXR clear. Will consult cardiology   7:55 AM Cardiology returning page, will see pt. Please see [facility] and Cardiology notes for further discussion of care.   BP 175/61 mmHg  Pulse 58  Temp(Src) 98.1 F (36.7 C)  Resp 14  SpO2 96%  Meds ordered this encounter  Medications  . LORazepam (ATIVAN) injection 0.5 mg    Sig:      Marrie Chandra Camprubi-Soms, PA-C 12/02/14 [phone]

## 2014-12-02 NOTE — H&P (Signed)
[facility] History and Physical                                                                                    Kimberly Hardin, is a 64 y.o. female  MRN: [id number]   DOB - 1950/08/17  Admit Date - 12/02/2014  Outpatient Primary MD for the patient is Jerlyn Ly, MD  Referring MD: [facility] / ER  Consulting M.D: [facility]- Cardiology   With History of -  Past Medical History  Diagnosis Date  . MI (myocardial infarction)     2 stents  . Diabetes mellitus without complication   . Hypertension   . Hyperlipidemia   . Allergic rhinitis   . Obesity   . Asthma   . Neuropathy   . Constipation   . History of kidney stones   . History of renal failure   . Anemia   . OSA (obstructive sleep apnea)     USES NASAL PRONGS  . Anxiety     PANIC ATTACKS      Past Surgical History  Procedure Laterality Date  . Eye surgery  3 years ago    laser surgery  . Cystoscopy w/ ureteral stent placement Bilateral 08/12/2012    Procedure: CYSTOSCOPY WITH RETROGRADE PYELOGRAM/URETERAL STENT PLACEMENT;  Surgeon: Alexis Frock, MD;  Location: [facility];  Service: Urology;  Laterality: Bilateral;  . Coronary angioplasty  2011/ 2012    CARDIAC STENTS X4  . Cystoscopy with retrograde pyelogram, ureteroscopy and stent placement Bilateral 10/15/2012    Procedure: [place], LEFT URETERAL STONE BASKETRY, BILATERAL RETROGRADES AND STENT [place];  Surgeon: Alexis Frock, MD;  Location: [facility];  Service: Urology;  Laterality: Bilateral;  . Holmium laser application Bilateral 09/20/3417    Procedure: HOLMIUM LASER APPLICATION;  Surgeon: Alexis Frock, MD;  Location: [facility];  Service: Urology;  Laterality: Bilateral;  . Left heart catheterization with coronary angiogram N/A 11/27/2012    Procedure: LEFT HEART CATHETERIZATION WITH CORONARY ANGIOGRAM;  Surgeon: Sinclair Grooms, MD;  Location: [facility] CATH LAB;  Service: Cardiovascular;  Laterality: N/A;    in for    Chief Complaint  Patient presents with  . Anxiety  . Chest Pain     HPI This is a 64 year old female patient with known history of CAD and prior MI, diabetes mellitus on insulin, hypertension, dyslipidemia, grade 1 diastolic dysfunction, obesity, sleep apnea on CPAP who presented to the ER with chest pain complaints. Patient was awakened during the night after having what appeared to be a nightmare and subsequently developed anterior chest tightness which eventually radiated to her left shoulder. Per husband she was noted to be hyperventilating and he had also noticed that prior to the episode her CPAP machine had become unplugged. Patient apparently described the anterior chest wall tightness as like "pins and needles" she also reports that this was not typical of her usual panic attack symptoms. EMS was called to the home and patient was given a dose of aspirin and one nitroglycerin with complete resolution of symptoms. Of note patient underwent a left heart catheterization in 2014 which revealed LAD stent restenosis as well as progression of disease in  the circumflex. Opted at that time to pursue medical therapy with specific recommendations from the cardiologist that if anginal symptoms recurred she would need to present for reevaluation and potential cath and PCI.  In the ER patient was afebrile somewhat mildly hypertensive with a BP of 175/61 and borderline bradycardia with a heart rate of 58, room air saturations were 96%. Laboratory data unremarkable with renal function at baseline, glucose slightly elevated at 208, initial troponin point of care completely normal at 0.00, EKG also unremarkable with persistent T-wave inversions and no ST segment changes noting this is similar to previous EKG in 2014. CBC was normal for patient with baseline slightly low hemoglobin of 11.7. Chest x-ray without any acute disease.  In further discussion with the patient she reports recently she has had increased  fatigue and this has been directly related to a new injectable cholesterol met her primary care physician placed her on (Repatha). She reported that when she had her initial MI she had unusual shoulder achiness and discomfort as well as a chest tightness and pressure that was icy-like in nature and the sense of anxiety and something bad going to happen. Patient reports CBGs have been better controlled and her primary care physician has been adjusting her insulins, she has also had an intentional 30 pound weight loss over the past 6 months. She also reported that in route to the hospital she moved her left arm and heard a clicking sensation and the previously noted discomfort had resolved after this.   Review of Systems   In addition to the HPI above,  No Fever-chills, myalgias or other constitutional symptoms No Headache, changes with Vision or hearing, new weakness, tingling, numbness in any extremity, No problems swallowing food or Liquids, indigestion/reflux No Cough or Shortness of Breath, palpitations, orthopnea or DOE No Abdominal pain, N/V; no melena or hematochezia, no dark tarry stools, Bowel movements are regular, No dysuria, hematuria or flank pain No new skin rashes, lesions, masses or bruises, No new joints pains-aches No recent weight gain No polyuria, polydypsia or polyphagia,  *A full 10 point Review of Systems was done, except as stated above, all other Review of Systems were negative.  Social History Social History  Substance Use Topics  . Smoking status: Never Smoker   . Smokeless tobacco: Never Used  . Alcohol Use: No    Resides at: Private residence  Lives with: Husband  Ambulatory status: With a cane   Family History Family History  Problem Relation Age of Onset  . Diabetes Father   . Heart failure Father   . Breast cancer Mother   . Diabetes Sister   . COPD Sister   . Stomach cancer Maternal Grandfather   . Diabetes Paternal Grandmother   . Diabetes  Paternal Grandfather      Prior to Admission medications   Medication Sig Start Date End Date Taking? Authorizing Provider  aspirin 325 MG tablet Take 325 mg by mouth daily.    Yes Historical Provider, MD  [place], Vaccinium myrtillus, 1000 MG CAPS Take 1,000 mg by mouth daily.   Yes Historical Provider, MD  carvedilol (COREG) 25 MG tablet TAKE 1 TABLET BY MOUTH TWICE DAILY 11/25/14  Yes Belva Crome, MD  Cholecalciferol (D3 DOTS) 2000 UNITS TBDP Take 2,000 Units by mouth daily.   Yes Historical Provider, MD  Coenzyme Q10 (COQ10) 200 MG CAPS Take 200 mg by mouth daily.   Yes Historical Provider, MD  [place] 565 MG CAPS Take 565 mg  by mouth 2 (two) times daily.   Yes Historical Provider, MD  insulin aspart (NOVOLOG) 100 UNIT/ML injection Inject 16 Units into the skin 3 (three) times daily with meals.    Yes Historical Provider, MD  insulin glargine (LANTUS) 100 UNIT/ML injection Inject 40 Units into the skin 2 (two) times daily.    Yes Historical Provider, MD  Multiple Vitamin (MULTIVITAMIN) capsule Take 1 capsule by mouth daily.   Yes Historical Provider, MD  nitroGLYCERIN (NITROSTAT) 0.4 MG SL tablet Place 0.4 mg under the tongue every 5 (five) minutes as needed for chest pain.   Yes Historical Provider, MD  Omega-3 Fatty Acids (FISH OIL) 1000 MG CAPS Take 1 capsule by mouth daily.   Yes Historical Provider, MD  Pomegranate 250 MG CAPS Take 250 mg by mouth 2 (two) times daily.   Yes Historical Provider, MD  Pyridoxine-Kelp-Lect-Vinegar (KLB6 PO) Take 1 tablet by mouth daily.    Yes Historical Provider, MD  TURMERIC PO Take 800 mg by mouth 2 (two) times daily.    Yes Historical Provider, MD    Allergies  Allergen Reactions  . Darvon [Propoxyphene Hcl] Nausea And Vomiting and Other (See Comments)    Headaches   . Betadine [Povidone Iodine]     Hives    . Flagyl [Metronidazole] Other (See Comments)    Unknown   . Levaquin [Levofloxacin In D5w] Other (See Comments)    hallucinations    . Librium [Chlordiazepoxide] Other (See Comments)    Dizziness   . Minocycline Other (See Comments)    Severe nausea and vomiting, severe diarrhea  . Morphine And Related Other (See Comments)    Seeing hallucinations, out of body experience-floating above her body feeling  . Prednisone Other (See Comments)    Vision disturbances, completely unfocused sight and caused some eyesight damage, after stopped taking med   . Statins Other (See Comments)    Caused muscle cramps  . Sudafed [Pseudoephedrine Hcl] Other (See Comments)    Causes High blood pressure  . Valium [Diazepam] Other (See Comments)    Hallucinations, caused tunnel vision.  Marland Kitchen Zoloft [Sertraline Hcl] Other (See Comments)    nightmares    Physical Exam  Vitals  Blood pressure 157/61, pulse 57, temperature 98.1 F (36.7 C), resp. rate 16, SpO2 97 %.   General:  In no acute distress, appears healthy and well nourished, somewhat drowsy after given Ativan in the ER  Psych:  Normal affect, Denies Suicidal or Homicidal ideations, Awake Alert, Oriented X 3. Speech and thought patterns are clear and appropriate, no apparent short term memory deficits  Neuro:   No focal neurological deficits, CN II through XII intact, Strength 5/5 all 4 extremities, Sensation intact all 4 extremities.  ENT:  Ears and Eyes appear Normal, Conjunctivae clear, PER. Moist oral mucosa without erythema or exudates.  Neck:  Supple, No lymphadenopathy appreciated  Respiratory:  Symmetrical chest wall movement, Good air movement bilaterally, CTAB. Room Air  Cardiac:  RRR, No Murmurs, no LE edema noted, no JVD, No carotid bruits, peripheral pulses palpable at 2+  Abdomen:  Positive bowel sounds, Soft, Non tender, Non distended,  No masses appreciated, no obvious hepatosplenomegaly  Skin:  No Cyanosis, Normal Skin Turgor, No Skin Rash or Bruise.  Extremities: Symmetrical without obvious trauma or injury,  no effusions.  Data  Review  CBC  Recent Labs Lab 12/02/14 0628  WBC 6.6  HGB 11.7*  HCT 35.4*  PLT 189  MCV 89.6  MCH 29.6  MCHC 33.1  RDW 13.2    Chemistries   Recent Labs Lab 12/02/14 0628  NA 140  K 4.3  CL 110  CO2 24  GLUCOSE 208*  BUN 31*  CREATININE 1.04*  CALCIUM 9.5    CrCl cannot be calculated (Unknown ideal weight.).  No results for input(s): TSH, T4TOTAL, T3FREE, THYROIDAB in the last 72 hours.  Invalid input(s): FREET3  Coagulation profile No results for input(s): INR, PROTIME in the last 168 hours.  No results for input(s): DDIMER in the last 72 hours.  Cardiac Enzymes No results for input(s): CKMB, TROPONINI, MYOGLOBIN in the last 168 hours.  Invalid input(s): CK  Invalid input(s): POCBNP  Urinalysis    Component Value Date/Time   COLORURINE RED* 08/12/2012 1730   APPEARANCEUR TURBID* 08/12/2012 1730   LABSPEC 1.022 08/12/2012 1730   PHURINE 5.0 08/12/2012 1730   GLUCOSEU 100* 08/12/2012 1730   HGBUR LARGE* 08/12/2012 1730   BILIRUBINUR MODERATE* 08/12/2012 1730   KETONESUR 15* 08/12/2012 1730   PROTEINUR 100* 08/12/2012 1730   UROBILINOGEN 1.0 08/12/2012 1730   NITRITE NEGATIVE 08/12/2012 1730   LEUKOCYTESUR LARGE* 08/12/2012 1730    Imaging results:   Dg Chest 2 View  12/02/2014   CLINICAL DATA:  Shortness of breath beginning last night.  EXAM: CHEST  2 VIEW  COMPARISON:  PA and lateral chest 10/09/2012.  FINDINGS: The lungs are clear. Heart size is normal. No pneumothorax or pleural effusion.  IMPRESSION: No acute disease.   Electronically Signed   By: Inge Rise M.D.   On: 12/02/2014 07:25     EKG: (Independently reviewed) sinus rhythm with T-wave inversions in leads 1 and 2 aVL and V4 through V6 as well as chronic reciprocal subtle ST elevation versus J-point elevation in V1 through V3 and unchanged from 2014 EKG   Assessment & Plan  Principal Problem:   Angina, class I/known CAD and prior stents with LAD restenosis/aggressive  circumflex disease (2014) -Admit observational status to telemetry -Consult cardiology -Cycle troponin -Echocardiogram -Suspect symptoms precipitated by disconnection of CPAP machine and resultant hypoxemia -Symptoms not typical of panic attack but also not typical of her previous ischemic symptoms but given her underlying known stent stenosis and progression of cervical flex disease from 2 years prior consideration must be given that this may be ischemic in nature and an equivalent of angina -Continue carvedilol, aspirin -Was on new dyslipidemic IM medication twice weekly (Repatha)  Active Problems:   DM2 (diabetes mellitus, type 2) -Continue Lantus insulin as well as NovoLog meal coverage as prior to admission -Provide SSI -Check hemoglobin A1c    HTN  -Currently controlled on above regimen    CKD II -renal function near baseline 29/0.92 (2014) -current 31/1.04 -follow closely    Diastolic dysfunction NYHA1 -Currently asymptomatic with normal chest x-ray    HLD -cont Repatha 2x weekly    Anemia, iron deficiency -Hemoglobin stable -Takes vitamin supplementation at home    Obstructive sleep apnea -Utilizes CPAP at hour sleep and when necessary for daytime naps    DVT Prophylaxis: Lovenox  Family Communication:   Husband at bedside  Code Status:  Full code  Condition:  Stable  Discharge disposition: Anticipate discharge back to home within the next 24 hours pending outcome of ischemic evaluation and recommendations from cardiology  Time spent in minutes : 60      ELLIS,ALLISON L. ANP on 12/02/2014 at 8:43 AM  Between 7am to 7pm - Pager - [phone]  After 7pm go to  [email or web address] - password [email or web address]  And look for the night coverage person covering me after hours  [facility]

## 2014-12-02 NOTE — ED Provider Notes (Signed)
Medical screening examination/treatment/procedure(s) were conducted as a shared visit with non-physician practitioner(s) and myself.  I personally evaluated the patient during the encounter.   EKG Interpretation   Date/Time:  Thursday December 02 2014 06:07:55 EDT Ventricular Rate:  60 PR Interval:  202 QRS Duration: 86 QT Interval:  457 QTC Calculation: 457 R Axis:   51 Text Interpretation:  Sinus rhythm Probable anterior infarct, age  indeterminate Lateral leads are also involved Baseline wander in lead(s)  II III aVF No significant change since 2014 Confirmed by Chico Cawood,  DO,  Kaelyn Innocent [phone]) on 12/02/2014 6:12:07 AM      Pt is a 64 y.o. female with history of CAD who presents emergency department with an episode of chest pressure, left arm tingling, shortness of breath, dizziness that started this morning. Symptoms resolved with nitroglycerin. Received aspirin with EMS. Hemodynamically stable. EKG shows T wave inversions in inferior and lateral leads that are unchanged compared to prior. We'll obtain cardiac labs, chest x-ray. Patient is currently chest pain-free. Will admit.  Layla Maw Annasophia Crocker, DO 12/02/14 [phone]

## 2014-12-03 DIAGNOSIS — I25111 Atherosclerotic heart disease of native coronary artery with angina pectoris with documented spasm: Secondary | ICD-10-CM | POA: Diagnosis not present

## 2014-12-03 DIAGNOSIS — I10 Essential (primary) hypertension: Secondary | ICD-10-CM

## 2014-12-03 DIAGNOSIS — I5031 Acute diastolic (congestive) heart failure: Secondary | ICD-10-CM

## 2014-12-03 DIAGNOSIS — E1129 Type 2 diabetes mellitus with other diabetic kidney complication: Secondary | ICD-10-CM | POA: Diagnosis not present

## 2014-12-03 DIAGNOSIS — N182 Chronic kidney disease, stage 2 (mild): Secondary | ICD-10-CM

## 2014-12-03 DIAGNOSIS — G4733 Obstructive sleep apnea (adult) (pediatric): Secondary | ICD-10-CM

## 2014-12-03 DIAGNOSIS — I208 Other forms of angina pectoris: Secondary | ICD-10-CM | POA: Diagnosis not present

## 2014-12-03 LAB — GLUCOSE, CAPILLARY
GLUCOSE-CAPILLARY: 194 mg/dL — AB (ref 65–99)
GLUCOSE-CAPILLARY: 160 mg/dL — AB (ref 65–99)
GLUCOSE-CAPILLARY: 123 mg/dL — AB (ref 65–99)
Glucose-Capillary: 198 mg/dL — ABNORMAL HIGH (ref 65–99)

## 2014-12-03 LAB — BASIC METABOLIC PANEL
Anion gap: 10 (ref 5–15)
BUN: 29 mg/dL — ABNORMAL HIGH (ref 6–20)
CO2: 23 mmol/L (ref 22–32)
Calcium: 9.2 mg/dL (ref 8.9–10.3)
Chloride: 105 mmol/L (ref 101–111)
Creatinine, Ser: 1.16 mg/dL — ABNORMAL HIGH (ref 0.44–1.00)
GFR calc non Af Amer: 49 mL/min — ABNORMAL LOW (ref >60)
GFR, EST AFRICAN AMERICAN: 57 mL/min — AB (ref >60)
Glucose, Bld: 251 mg/dL — ABNORMAL HIGH (ref 65–99)
POTASSIUM: 4.2 mmol/L (ref 3.5–5.1)
SODIUM: 138 mmol/L (ref 135–145)

## 2014-12-03 LAB — HEMOGLOBIN A1C
Hgb A1c MFr Bld: 8.6 % — ABNORMAL HIGH (ref 4.8–5.6)
Mean Plasma Glucose: 200 mg/dL

## 2014-12-03 LAB — TROPONIN I

## 2014-12-03 MED ORDER — SPIRONOLACTONE 25 MG PO TABS
25.0000 mg | ORAL_TABLET | Freq: Every day | ORAL | Status: DC
  Administered 2014-12-03 – 2014-12-04 (×2): 25 mg via ORAL
  Filled 2014-12-03 (×2): qty 1

## 2014-12-03 MED ORDER — POLYETHYLENE GLYCOL 3350 17 G PO PACK
17.0000 g | PACK | Freq: Every day | ORAL | Status: DC
Start: 2014-12-03 — End: 2014-12-05
  Administered 2014-12-03 – 2014-12-04 (×2): 17 g via ORAL
  Filled 2014-12-03: qty 1

## 2014-12-03 NOTE — Plan of Care (Signed)
Problem: Phase I Progression Outcomes Goal: OOB as tolerated unless otherwise ordered Outcome: Completed/Met Date Met:  12/03/14 Pt tolerated ambulation well

## 2014-12-03 NOTE — Progress Notes (Signed)
Pt has tubing and mask for CPAP machine from home, she states she will place herself on when she is ready for bed. Encouraged her to call with any questions or concerns; pt communicates understanding.

## 2014-12-03 NOTE — Progress Notes (Signed)
Patient Name: Kimberly Hardin Date of Encounter: 12/03/2014  Principal Problem:   Angina, class I Active Problems:   DM2 (diabetes mellitus, type 2)   HTN (hypertension)   Coronary atherosclerosis of native coronary artery   Anemia, iron deficiency   Obstructive sleep apnea   Acute diastolic congestive heart failure, NYHA class 2   HLD (hyperlipidemia)   CKD (chronic kidney disease), stage II    Primary Cardiologist: Dr. Katrinka Blazing Patient Profile: 64yo female w/ PMH of CAD (previous revascularization- CABG recommended, patient declined and prefers medical therapy), DM, morbid obesity, HTN, and OSA admitted on 12/02/14 for atypical chest discomfort.   SUBJECTIVE: Denies any chest pain, palpitations, or shortness of breath. Relieved to know her cardiac enzyme levels are negative.  OBJECTIVE Filed Vitals:   12/02/14 1345 12/02/14 1500 12/02/14 1934 12/03/14 0457  BP: 121/46 131/39 151/44 158/56  Pulse: 57 57 61 56  Temp:  98.6 F (37 C) 98.8 F (37.1 C) 98.1 F (36.7 C)  TempSrc:   Oral Oral  Resp: 19  18 18   Height:  4\' 11"  (1.499 m)    SpO2: 97% 99% 97% 99%    Intake/Output Summary (Last 24 hours) at 12/03/14 1133 Last data filed at 12/03/14 0655  Gross per 24 hour  Intake      0 ml  Output   1375 ml  Net  -1375 ml   There were no vitals filed for this visit.  PHYSICAL EXAM General: Well developed, well nourished, female in no acute distress. Head: Normocephalic, atraumatic.  Neck: Supple without bruits, JVD not elevated. Lungs:  Resp regular and unlabored, CTA without wheezing or rales. Heart: RRR, S1, S2, no S3, S4, or murmur; no rub. Abdomen: Soft, non-tender, non-distended with normoactive bowel sounds. No hepatomegaly. No rebound/guarding. No obvious abdominal masses. Extremities: No clubbing, cyanosis, trace edema bilaterally. Distal pedal pulses are 2+ bilaterally. Neuro: Alert and oriented X 3. Moves all extremities spontaneously. Psych: Normal  affect.   LABS: CBC: Recent Labs  12/02/14 0628  WBC 6.6  HGB 11.7*  HCT 35.4*  MCV 89.6  PLT 189   Basic Metabolic Panel: Recent Labs  12/02/14 0628 12/03/14 1020  NA 140 138  K 4.3 4.2  CL 110 105  CO2 24 23  GLUCOSE 208* 251*  BUN 31* 29*  CREATININE 1.04* 1.16*  CALCIUM 9.5 9.2   Cardiac Enzymes: Recent Labs  12/02/14 1103 12/02/14 1743 12/03/14 0010  TROPONINI <0.03 0.03 <0.03    Recent Labs  12/02/14 0622  TROPIPOC 0.00   BNP:  B NATRIURETIC PEPTIDE  Date/Time Value Ref Range Status  12/02/2014 10:25 AM 195.4* 0.0 - 100.0 pg/mL Final   Hemoglobin A1C: Recent Labs  12/02/14 0837  HGBA1C 8.6*    TELE: NSR with rate in 50's - 60's. Frequent PVC's.        ECHO: 12/02/14 Study Conclusions - Left ventricle: The cavity size was normal. There was severe focal basal hypertrophy of the septum. Systolic function was normal. The estimated ejection fraction was in the range of 55% to 60%. Wall motion was normal; there were no regional wall motion abnormalities. Doppler parameters are consistent with abnormal left ventricular relaxation (grade 1 diastolic dysfunction). Doppler parameters are consistent with high ventricular filling pressure. - Mitral valve: Calcified annulus.  Radiology/Studies: Dg Chest 2 View: 12/02/2014   CLINICAL DATA:  Shortness of breath beginning last night.  EXAM: CHEST  2 VIEW  COMPARISON:  PA and lateral chest 10/09/2012.  FINDINGS: The lungs are clear. Heart size is normal. No pneumothorax or pleural effusion.  IMPRESSION: No acute disease.   Electronically Signed   By: Drusilla Kanner M.D.   On: 12/02/2014 07:25     Current Medications:  . aspirin  325 mg Oral Daily  . carvedilol  25 mg Oral BID  . enoxaparin (LOVENOX) injection  40 mg Subcutaneous Q24H  . furosemide  20 mg Intravenous Q12H  . insulin aspart  0-20 Units Subcutaneous TID WC  . insulin aspart  0-5 Units Subcutaneous QHS  . insulin aspart   16 Units Subcutaneous TID WC  . insulin glargine  26 Units Subcutaneous BID  . multivitamin with minerals  1 tablet Oral Daily  . omega-3 acid ethyl esters  1 g Oral BID  . polyethylene glycol  17 g Oral Daily      ASSESSMENT AND PLAN:  1. Acute Diastolic Heart Failure - Echo on 12/02/14 showed EF of 55-60% but Grade 1 Diastolic Dysfunction. - Currently on Lasix  BID. Will continue to monitor renal function and potassium values closely. Will likely need to continue diuresis over the weekend. - Net -3.2L thus far. Will obtain daily weights.  2. Atypical Chest Discomfort - History of multivessel CAD with previous revascularization. Cath in 2014 showed proximal focal ISR in LAD stent and subtotal occlusion of the mid to distal circumflex beyond patent stents in the mid vessel. Left to right collaterals from the right coronary to the distal circumflex. CABG was recommended but the patient declined and prefers medical therapy. - cyclic troponin values have been negative. EKG without high risk features. - continue current medical therapy.  3. HLD - on Repatha (statin intolerant)  4. HTN - BP has been 121/39 - 158/58 in the past 24 hours.  5. DM - per IM  6. CKD (chronic kidney disease), stage II - serum creatinine 1.16 on 12/03/14.  Otherwise, per IM.  Alexis Frock , PA-C 11:33 AM 12/03/2014

## 2014-12-03 NOTE — Progress Notes (Signed)
[facility] PROGRESS NOTE There were no vitals filed for this visit.      Intake/Output Summary (Last 24 hours) at 12/03/14 0848 Last data filed at 12/03/14 6213  Gross per 24 hour  Intake      0 ml  Output   2275 ml  Net  -2275 ml     Assessment/Plan: Acute diastolic congestive heart failure, NYHA class 2: - Cardiology was consulted, continue IV diuretics, she has diuresed 2.2 L. - He is about 5-6 kg overweight. - Continue daily with strict I's and O's. BNP was elevated at 176 which is probably high due to her obesity.  Chest tightness: No events on telemetry, cardiac biomarkers have remained negative. Follow-up motion abnormalities. Likely due to heart failure  DM2 (diabetes mellitus, type 2): Continue Lantus plus sliding scale. Hemoglobin A1c was 8.6.  Essential  HTN (hypertension) Currently controlled on current regimen.  Chronic kidney disease stage II: Baseline creatinine 0.9-1.1.  -Deficiency anemia: Hemoglobin stable continue supplementation.  Obstructive sleep apnea: - Continue C Pap and night    Code Status: full Family Communication: husband  Disposition Plan: inpatient   Consultants:  Cardiology  Procedures: ECHO: pending  Antibiotics:  None  HPI/Subjective: She relates her shortness of breath is better.  Objective: Filed Vitals:   12/02/14 1345 12/02/14 1500 12/02/14 1934 12/03/14 0457  BP: 121/46 131/39 151/44 158/56  Pulse: 57 57 61 56  Temp:  98.6 F (37 C) 98.8 F (37.1 C) 98.1 F (36.7 C)  TempSrc:   Oral Oral  Resp: Height:   (1.499 m)    SpO2: 97% 99% 97% 99%     Exam:  General: Alert, awake, oriented x3, in no acute distress.  HEENT: No bruits, no goiter.  Heart: Regular rate and rhythm, trace lower extremity edema. Lungs: Good air movement, clear Abdomen: Soft, nontender, nondistended, positive bowel sounds.  Neuro: Grossly intact, nonfocal.   Data Reviewed: Basic Metabolic  Panel:  Recent Labs Lab 12/02/14 0628  NA 140  K 4.3  CL 110  CO2 24  GLUCOSE 208*  BUN 31*  CREATININE 1.04*  CALCIUM 9.5   Liver Function Tests: No results for input(s): AST, ALT, ALKPHOS, BILITOT, PROT, ALBUMIN in the last 168 hours. No results for input(s): LIPASE, AMYLASE in the last 168 hours. No results for input(s): AMMONIA in the last 168 hours. CBC:  Recent Labs Lab 12/02/14 0628  WBC 6.6  HGB 11.7*  HCT 35.4*  MCV 89.6  PLT 189   Cardiac Enzymes:  Recent Labs Lab 12/02/14 1103 12/02/14 1743 12/03/14 0010  TROPONINI <0.03 0.03 <0.03   BNP (last 3 results)  Recent Labs  12/02/14 1025  BNP 195.4*    ProBNP (last 3 results) No results for input(s): PROBNP in the last 8760 hours.  CBG:  Recent Labs Lab 12/02/14 1130 12/02/14 1341 12/02/14 1621 12/02/14 2103 12/03/14 0644  GLUCAP 188* 256* 183* 138* 198*    No results found for this or any previous visit (from the past 240 hour(s)).   Studies: Dg Chest 2 View  12/02/2014   CLINICAL DATA:  Shortness of breath beginning last night.  EXAM: CHEST  2 VIEW  COMPARISON:  PA and lateral chest 10/09/2012.  FINDINGS: The lungs are clear. Heart size is normal. No pneumothorax or pleural effusion.  IMPRESSION: No acute disease.   Electronically Signed   By: Drusilla Kanner M.D.   On: 12/02/2014 07:25    Scheduled Meds: .  aspirin  325 mg Oral Daily  . carvedilol  25 mg Oral BID  . enoxaparin (LOVENOX) injection  40 mg Subcutaneous Q24H  . furosemide  20 mg Intravenous Q12H  . insulin aspart  0-20 Units Subcutaneous TID WC  . insulin aspart  0-5 Units Subcutaneous QHS  . insulin aspart  16 Units Subcutaneous TID WC  . insulin glargine  26 Units Subcutaneous BID  . multivitamin with minerals  1 tablet Oral Daily  . omega-3 acid ethyl esters  1 g Oral BID   Continuous Infusions:    Marinda Elk  [facility] Pager [phone] If 7PM-7AM, please contact night-coverage at  [email or web address], password [facility] 12/03/2014, 8:48 AM

## 2014-12-03 NOTE — Progress Notes (Deleted)
The patient has been seen in conjunction with Wandra Mannan, PA. All aspects of care have been considered and discussed. The patient has been personally interviewed, examined, and all clinical data has been reviewed.   Based on clinical data, evidence for volume overload is meager. Orthopnea is likely related to diastolic heart failure. Echo this admission demonstrates preserved LV systolic function.  She is clinically stable. Currently 3.5 L negative since admission. Patient has not previously been on outpatient diuretic therapy.  Plan to convert to oral dosing, anticipate discharge in a.m., and suggests early follow-up of renal function as OP.  No plan for ischemic evaluation.

## 2014-12-04 DIAGNOSIS — I10 Essential (primary) hypertension: Secondary | ICD-10-CM | POA: Diagnosis not present

## 2014-12-04 DIAGNOSIS — N182 Chronic kidney disease, stage 2 (mild): Secondary | ICD-10-CM | POA: Diagnosis not present

## 2014-12-04 DIAGNOSIS — I5031 Acute diastolic (congestive) heart failure: Secondary | ICD-10-CM | POA: Diagnosis not present

## 2014-12-04 LAB — GLUCOSE, CAPILLARY
GLUCOSE-CAPILLARY: 167 mg/dL — AB (ref 65–99)
GLUCOSE-CAPILLARY: 101 mg/dL — AB (ref 65–99)
Glucose-Capillary: 179 mg/dL — ABNORMAL HIGH (ref 65–99)
Glucose-Capillary: 186 mg/dL — ABNORMAL HIGH (ref 65–99)

## 2014-12-04 LAB — BASIC METABOLIC PANEL
ANION GAP: 10 (ref 5–15)
BUN: 34 mg/dL — ABNORMAL HIGH (ref 6–20)
CO2: 24 mmol/L (ref 22–32)
Calcium: 9.2 mg/dL (ref 8.9–10.3)
Chloride: 105 mmol/L (ref 101–111)
Creatinine, Ser: 1.11 mg/dL — ABNORMAL HIGH (ref 0.44–1.00)
GFR calc Af Amer: >60 mL/min (ref >60)
GFR, EST NON AFRICAN AMERICAN: 52 mL/min — AB (ref >60)
Glucose, Bld: 173 mg/dL — ABNORMAL HIGH (ref 65–99)
POTASSIUM: 4.1 mmol/L (ref 3.5–5.1)
SODIUM: 139 mmol/L (ref 135–145)

## 2014-12-04 MED ORDER — FUROSEMIDE 10 MG/ML IJ SOLN
40.0000 mg | Freq: Every day | INTRAMUSCULAR | Status: DC
  Administered 2014-12-04: 40 mg via INTRAVENOUS

## 2014-12-04 NOTE — Progress Notes (Signed)
    Subjective:  Denies CP or dyspnea   Objective:  Filed Vitals:   12/03/14 1352 12/03/14 1843 12/03/14 1949 12/04/14 0414  BP: 121/41  123/44 138/41  Pulse: 52  56 50  Temp: 97.7 F (36.5 C)  98.4 F (36.9 C) 98.7 F (37.1 C)  TempSrc: Oral  Oral Oral  Resp: Height:   (1.499 m)    Weight:  227 lb 11.8 oz (103.3 kg)  231 lb 8 oz (105.008 kg)  SpO2: 97%  97% 97%    Intake/Output from previous day:  Intake/Output Summary (Last 24 hours) at 12/04/14 1054 Last data filed at 12/04/14 0418  Gross per 24 hour  Intake    300 ml  Output   1500 ml  Net  -1200 ml    Physical Exam: Physical exam: Well-developed obese in no acute distress.  Skin is warm and dry.  HEENT is normal.  Neck is supple.  Chest is clear to auscultation with normal expansion.  Cardiovascular exam is regular rate and rhythm.  Abdominal exam nontender or distended. No masses palpated. Extremities show trace edema. neuro grossly intact    Lab Results: Basic Metabolic Panel:  Recent Labs  04/17/70 1020 12/04/14 0258  NA 138 139  K 4.2 4.1  CL 105 105  CO2 23 24  GLUCOSE 251* 173*  BUN 29* 34*  CREATININE 1.16* 1.11*  CALCIUM 9.2 9.2   CBC:  Recent Labs  12/02/14 0628  WBC 6.6  HGB 11.7*  HCT 35.4*  MCV 89.6  PLT 189   Cardiac Enzymes:  Recent Labs  12/02/14 1103 12/02/14 1743 12/03/14 0010  TROPONINI <0.03 0.03 <0.03     Assessment/Plan:  1 chest pain-patient has ruled out. No plans for further ischemia evaluation. 2 acute on chronic diastolic congestive heart failure-symptoms are improving. BUN increasing. Discontinue Lasix after a.m. Dose. Continue spironolactone. Recheck renal function tomorrow morning. 3 coronary artery disease-continue aspirin. Intolerant to statins. 4 hypertension-continue present medications. Probable discharge tomorrow morning if renal function stable. Follow-up Dr. Katrinka Blazing.  Kimberly Hardin 12/04/2014, 10:54 AM

## 2014-12-04 NOTE — Progress Notes (Signed)
Patient stated that she would put on her CPAP when she is ready for bed. RT notified patient to call if assistance is needed.

## 2014-12-04 NOTE — Progress Notes (Signed)
[facility] PROGRESS NOTE Filed Weights   12/03/14 1843 12/04/14 0414  Weight: 103.3 kg (227 lb 11.8 oz) 105.008 kg (231 lb 8 oz)        Intake/Output Summary (Last 24 hours) at 12/04/14 0859 Last data filed at 12/04/14 0418  Gross per 24 hour  Intake    300 ml  Output   1500 ml  Net  -1200 ml     Assessment/Plan: Acute diastolic congestive heart failure, NYHA class 2: - Cardiology was consulted, continue IV diuretics, she has diuresed 2.2 L. - He is about 4-5 kg overweight. Give extra dose of lasix IV. - Continue daily with strict I's and O's. BNP was elevated at 176 which is probably high due to her obesity.  Chest tightness: No events on telemetry, cardiac biomarkers have remained negative. ECHO no wall motion on echocardiogram. Likely due to heart failure  DM2 (diabetes mellitus, type 2): Continue Lantus plus sliding scale. Hemoglobin A1c was 8.6.  Essential  HTN (hypertension) Currently controlled on current regimen.  Chronic kidney disease stage II: Baseline creatinine 0.9-1.1.  -Deficiency anemia: Hemoglobin stable continue supplementation.  Obstructive sleep apnea: - Continue C Pap and night    Code Status: full Family Communication: husband  Disposition Plan: inpatient   Consultants:  Cardiology  Procedures: ECHO: pending  Antibiotics:  None  HPI/Subjective: She relates her shortness of breath is better.  Objective: Filed Vitals:   12/03/14 1352 12/03/14 1843 12/03/14 1949 12/04/14 0414  BP: 121/41  123/44 138/41  Pulse: 52  56 50  Temp: 97.7 F (36.5 C)  98.4 F (36.9 C) 98.7 F (37.1 C)  TempSrc: Oral  Oral Oral  Resp: Height:   (1.499 m)    Weight:  103.3 kg (227 lb 11.8 oz)  105.008 kg (231 lb 8 oz)  SpO2: 97%  97% 97%     Exam:  General: Alert, awake, oriented x3, in no acute distress.  HEENT: No bruits, no goiter.  Heart: Regular rate and rhythm, trace lower extremity edema. Lungs: Good  air movement, clear Abdomen: Soft, nontender, nondistended, positive bowel sounds.  Neuro: Grossly intact, nonfocal.   Data Reviewed: Basic Metabolic Panel:  Recent Labs Lab 12/02/14 0628 12/03/14 1020 12/04/14 0258  NA 140 138 139  K 4.3 4.2 4.1  CL 110 105 105  CO2 GLUCOSE 208* 251* 173*  BUN 31* 29* 34*  CREATININE 1.04* 1.16* 1.11*  CALCIUM 9.5 9.2 9.2   Liver Function Tests: No results for input(s): AST, ALT, ALKPHOS, BILITOT, PROT, ALBUMIN in the last 168 hours. No results for input(s): LIPASE, AMYLASE in the last 168 hours. No results for input(s): AMMONIA in the last 168 hours. CBC:  Recent Labs Lab 12/02/14 0628  WBC 6.6  HGB 11.7*  HCT 35.4*  MCV 89.6  PLT 189   Cardiac Enzymes:  Recent Labs Lab 12/02/14 1103 12/02/14 1743 12/03/14 0010  TROPONINI <0.03 0.03 <0.03   BNP (last 3 results)  Recent Labs  12/02/14 1025  BNP 195.4*    ProBNP (last 3 results) No results for input(s): PROBNP in the last 8760 hours.  CBG:  Recent Labs Lab 12/03/14 0644 12/03/14 1108 12/03/14 1703 12/03/14 2059 12/04/14 0552  GLUCAP 198* 194* 160* 123* 167*    No results found for this or any previous visit (from the past 240 hour(s)).   Studies: No results found.  Scheduled Meds: . aspirin  325 mg Oral Daily  .  carvedilol  25 mg Oral BID  . enoxaparin (LOVENOX) injection  40 mg Subcutaneous Q24H  . insulin aspart  0-20 Units Subcutaneous TID WC  . insulin aspart  0-5 Units Subcutaneous QHS  . insulin aspart  16 Units Subcutaneous TID WC  . insulin glargine  26 Units Subcutaneous BID  . multivitamin with minerals  1 tablet Oral Daily  . omega-3 acid ethyl esters  1 g Oral BID  . polyethylene glycol  17 g Oral Daily  . spironolactone  25 mg Oral Daily   Continuous Infusions:    Marinda Elk  [facility] Pager [phone] If 7PM-7AM, please contact night-coverage at [email or web address], password [facility] 12/04/2014, 8:59 AM

## 2014-12-05 DIAGNOSIS — I208 Other forms of angina pectoris: Secondary | ICD-10-CM

## 2014-12-05 DIAGNOSIS — N182 Chronic kidney disease, stage 2 (mild): Secondary | ICD-10-CM | POA: Diagnosis not present

## 2014-12-05 DIAGNOSIS — E1121 Type 2 diabetes mellitus with diabetic nephropathy: Secondary | ICD-10-CM

## 2014-12-05 DIAGNOSIS — E1129 Type 2 diabetes mellitus with other diabetic kidney complication: Secondary | ICD-10-CM | POA: Diagnosis not present

## 2014-12-05 DIAGNOSIS — I5031 Acute diastolic (congestive) heart failure: Secondary | ICD-10-CM | POA: Diagnosis not present

## 2014-12-05 DIAGNOSIS — I119 Hypertensive heart disease without heart failure: Secondary | ICD-10-CM

## 2014-12-05 LAB — BASIC METABOLIC PANEL
ANION GAP: 11 (ref 5–15)
BUN: 34 mg/dL — ABNORMAL HIGH (ref 6–20)
CHLORIDE: 103 mmol/L (ref 101–111)
CO2: 24 mmol/L (ref 22–32)
Calcium: 9.4 mg/dL (ref 8.9–10.3)
Creatinine, Ser: 1.14 mg/dL — ABNORMAL HIGH (ref 0.44–1.00)
GFR calc non Af Amer: 50 mL/min — ABNORMAL LOW (ref >60)
GFR, EST AFRICAN AMERICAN: 58 mL/min — AB (ref >60)
Glucose, Bld: 195 mg/dL — ABNORMAL HIGH (ref 65–99)
POTASSIUM: 4.2 mmol/L (ref 3.5–5.1)
SODIUM: 138 mmol/L (ref 135–145)

## 2014-12-05 LAB — GLUCOSE, CAPILLARY: GLUCOSE-CAPILLARY: 174 mg/dL — AB (ref 65–99)

## 2014-12-05 MED ORDER — LISINOPRIL 5 MG PO TABS: 5.0000 mg | ORAL_TABLET | Freq: Every day | ORAL | Status: DC

## 2014-12-05 MED ORDER — FUROSEMIDE 20 MG PO TABS: 20.0000 mg | ORAL_TABLET | Freq: Every day | ORAL | Status: DC

## 2014-12-05 MED ORDER — ISOSORBIDE MONONITRATE ER 30 MG PO TB24: 15.0000 mg | ORAL_TABLET | Freq: Every day | ORAL | Status: DC

## 2014-12-05 MED ORDER — LISINOPRIL 5 MG PO TABS
5.0000 mg | ORAL_TABLET | Freq: Every day | ORAL | Status: DC
Start: 2014-12-05 — End: 2014-12-05

## 2014-12-05 MED ORDER — SPIRONOLACTONE 25 MG PO TABS
25.0000 mg | ORAL_TABLET | Freq: Every day | ORAL | Status: DC
Start: 2014-12-05 — End: 2014-12-05

## 2014-12-05 NOTE — Progress Notes (Signed)
    Subjective:  Denies CP or dyspnea   Objective:  Filed Vitals:   12/04/14 0414 12/04/14 1314 12/04/14 1958 12/05/14 0424  BP: 138/41 115/45 135/51 149/54  Pulse: 50 55 57 51  Temp: 98.7 F (37.1 C) 98.1 F (36.7 C) 98 F (36.7 C) 97.8 F (36.6 C)  TempSrc: Oral Oral Oral Oral  Resp: Height:      Weight: 231 lb 8 oz (105.008 kg)   231 lb 7.7 oz (105 kg)  SpO2: 97% 98% 99% 97%    Intake/Output from previous day:  Intake/Output Summary (Last 24 hours) at 12/05/14 0936 Last data filed at 12/05/14 0700  Gross per 24 hour  Intake    840 ml  Output   2000 ml  Net  -1160 ml    Physical Exam: Physical exam: Well-developed obese in no acute distress.  Skin is warm and dry.  HEENT is normal.  Neck is supple.  Chest is clear to auscultation with normal expansion.  Cardiovascular exam is regular rate and rhythm.  Abdominal exam nontender or distended. No masses palpated. Extremities show trace edema. neuro grossly intact    Lab Results: Basic Metabolic Panel:  Recent Labs  16/10/96 0258 12/05/14 0508  NA 139 138  K 4.1 4.2  CL 105 103  CO2 24 24  GLUCOSE 173* 195*  BUN 34* 34*  CREATININE 1.11* 1.14*  CALCIUM 9.2 9.4   Cardiac Enzymes:  Recent Labs  12/02/14 1103 12/02/14 1743 12/03/14 0010  TROPONINI <0.03 0.03 <0.03     Assessment/Plan:  1 chest pain-patient has ruled out. No plans for further ischemia evaluation. 2 acute on chronic diastolic congestive heart failure-symptoms improved. DC spironolactone. Treat with lasix 20 mg daily. 3 coronary artery disease-continue aspirin. Intolerant to statins. 4 hypertension-BP mildly elevated; add low dose lisinopril. OK to DC today; check BMET one week; results to Dr Katrinka Blazing. Follow-up Dr. Katrinka Blazing as scheduled.  Olga Millers 12/05/2014, 9:36 AM

## 2014-12-05 NOTE — Progress Notes (Signed)
Discharged to home with family office visits in place teaching done  

## 2014-12-05 NOTE — Discharge Summary (Addendum)
Physician Discharge Summary  Kimberly Hardin WNU:[id number] DOB: [birth year] DOA: 12/02/2014  PCP: Jerlyn Ly, MD  Admit date: 12/02/2014 Discharge date: 12/05/2014  Time spent: 35  minutes  Recommendations for Outpatient Follow-up:  1. Follow-up with cardiology Dr. Daneen Schick in 1 week check a basic metabolic panel. 2. Follow-up with your primary care doctor in 2-4 weeks. Continue titrate insulin as needed. 3. Home health to check a basic metabolic panel in a week he started to Dr. Daneen Schick.  Discharge Diagnoses:  Principal Problem:   Angina, class I Active Problems:   DM2 (diabetes mellitus, type 2)   HTN (hypertension)   Coronary atherosclerosis of native coronary artery   Anemia, iron deficiency   Obstructive sleep apnea   Acute diastolic congestive heart failure, NYHA class 2   HLD (hyperlipidemia)   CKD (chronic kidney disease), stage II   Diabetic nephropathy   Hypertensive heart disease   Discharge Condition: stable  Diet recommendation: low sodium  Filed Weights   12/03/14 1843 12/04/14 0414 12/05/14 0424  Weight: 103.3 kg (227 lb 11.8 oz) 105.008 kg (231 lb 8 oz) 105 kg (231 lb 7.7 oz)    History of present illness:  64 year old female with known history of coronary artery disease and MIs, insulin-dependent diabetes mellitus, essential hypertension and grade 1 diastolic heart failure came into the hospital for increase in fatigue related to activities and paroxysmal nocturnal dyspnea  Hospital Course:  Acute diastolic heart failure [place] class Heart Association 2/hypertensive heart disease.: Cardiology was consulted she was started on IV diuretics, and she diuresed over 4 L. She was changed to spironolactone and lisinopril was added and her weight came down nicely. Her estimated dry weight is around 105 kg. She will continue Coreg, Lasix and lisinopril as an outpatient. Home health to check a basic metabolic panel and forwarded to primary care doctor and  cardiology. She had no events on telemetry.  Controlled diabetes mellitus type 2: No changes were made to her regimen, her A1c was 8.6. Lisinopril was added.  Essential hypertension: Borderline control, she was started on lisinopril she'll follow-up with cardiology as an outpatient.  Chronic kidney see stage II/ diabetic nephropathy: With baseline creatinine is 0.9-1.1 Remains at baseline.  Iron deficiency anemia: Hemoglobin stable.   Procedures:  2-D echo on 12/02/2014 showed an EF of 55% and grade 1 diastolic heart failure  Chest x-ray  Consultations:  Cardiology  Discharge Exam: Filed Vitals:   12/05/14 0424  BP: 149/54  Pulse: 51  Temp: 97.8 F (36.6 C)  Resp: 18    General: Alert and oriented 3 Cardiovascular: Regular rate and rhythm Respiratory: Good air movement and clear to auscultation.  Discharge Instructions   Discharge Instructions    Diet - low sodium heart healthy    Complete by:  As directed      Increase activity slowly    Complete by:  As directed           Current Discharge Medication List    START taking these medications   Details  lisinopril (PRINIVIL,ZESTRIL) 5 MG tablet Take 1 tablet (5 mg total) by mouth daily. Qty: 30 tablet, Refills: 0    spironolactone (ALDACTONE) 25 MG tablet Take 1 tablet (25 mg total) by mouth daily. Qty: 30 tablet, Refills: 0      CONTINUE these medications which have NOT CHANGED   Details  aspirin 325 MG tablet Take 325 mg by mouth daily.     Bilberry, Vaccinium myrtillus, 1000 MG  CAPS Take 1,000 mg by mouth daily.    carvedilol (COREG) 25 MG tablet TAKE 1 TABLET BY MOUTH TWICE DAILY Qty: 60 tablet, Refills: 1    Cholecalciferol (D3 DOTS) 2000 UNITS TBDP Take 2,000 Units by mouth daily.    Coenzyme Q10 (COQ10) 200 MG CAPS Take 200 mg by mouth daily.    Hawthorn 565 MG CAPS Take 565 mg by mouth 2 (two) times daily.    insulin aspart (NOVOLOG) 100 UNIT/ML injection Inject 16 Units into the  skin 3 (three) times daily with meals.     insulin glargine (LANTUS) 100 UNIT/ML injection Inject 40 Units into the skin 2 (two) times daily.     Multiple Vitamin (MULTIVITAMIN) capsule Take 1 capsule by mouth daily.    nitroGLYCERIN (NITROSTAT) 0.4 MG SL tablet Place 0.4 mg under the tongue every 5 (five) minutes as needed for chest pain.    Omega-3 Fatty Acids (FISH OIL) 1000 MG CAPS Take 1 capsule by mouth daily.    Pomegranate 250 MG CAPS Take 250 mg by mouth 2 (two) times daily.    Pyridoxine-Kelp-Lect-Vinegar (KLB6 PO) Take 1 tablet by mouth daily.     TURMERIC PO Take 800 mg by mouth 2 (two) times daily.        Allergies  Allergen Reactions  . Darvon [Propoxyphene Hcl] Nausea And Vomiting and Other (See Comments)    Headaches   . Betadine [Povidone Iodine]     Hives    . Flagyl [Metronidazole] Other (See Comments)    Unknown   . Levaquin [Levofloxacin In D5w] Other (See Comments)    hallucinations  . Librium [Chlordiazepoxide] Other (See Comments)    Dizziness   . Minocycline Other (See Comments)    Severe nausea and vomiting, severe diarrhea  . Morphine And Related Other (See Comments)    Seeing hallucinations, out of body experience-floating above her body feeling  . Prednisone Other (See Comments)    Vision disturbances, completely unfocused sight and caused some eyesight damage, after stopped taking med   . Statins Other (See Comments)    Caused muscle cramps  . Sudafed [Pseudoephedrine Hcl] Other (See Comments)    Causes High blood pressure  . Valium [Diazepam] Other (See Comments)    Hallucinations, caused tunnel vision.  Marland Kitchen Zoloft [Sertraline Hcl] Other (See Comments)    nightmares   Follow-up Information    Follow up with Sinclair Grooms, MD In 1 week.   Specialty:  Cardiology   Why:  hospital follow up check a b-met   Contact information:   [address] [place] [address] [phone]        The results of significant  diagnostics from this hospitalization (including imaging, microbiology, ancillary and laboratory) are listed below for reference.    Significant Diagnostic Studies: Dg Chest 2 View  12/02/2014   CLINICAL DATA:  Shortness of breath beginning last night.  EXAM: CHEST  2 VIEW  COMPARISON:  PA and lateral chest 10/09/2012.  FINDINGS: The lungs are clear. Heart size is normal. No pneumothorax or pleural effusion.  IMPRESSION: No acute disease.   Electronically Signed   By: Inge Rise M.D.   On: 12/02/2014 07:25    Microbiology: No results found for this or any previous visit (from the past 240 hour(s)).   Labs: Basic Metabolic Panel:  Recent Labs Lab 12/02/14 0628 12/03/14 1020 12/04/14 0258 12/05/14 0508  NA 140 138 139 138  K 4.3 4.2 4.1 4.2  CL 110 105 105 103  CO2 _0 GLUCOSE 208* 251* 173* 195*  BUN 31* 29* 34* 34*  CREATININE 1.04* 1.16* 1.11* 1.14*  CALCIUM 9.5 9.2 9.2 9.4   Liver Function Tests: No results for input(s): AST, ALT, ALKPHOS, BILITOT, PROT, ALBUMIN in the last 168 hours. No results for input(s): LIPASE, AMYLASE in the last 168 hours. No results for input(s): AMMONIA in the last 168 hours. CBC:  Recent Labs Lab 12/02/14 0628  WBC 6.6  HGB 11.7*  HCT 35.4*  MCV 89.6  PLT 189   Cardiac Enzymes:  Recent Labs Lab 12/02/14 1103 12/02/14 1743 12/03/14 0010  TROPONINI <0.03 0.03 <0.03   BNP: BNP (last 3 results)  Recent Labs  12/02/14 1025  BNP 195.4*    ProBNP (last 3 results) No results for input(s): PROBNP in the last 8760 hours.  CBG:  Recent Labs Lab 12/04/14 0552 12/04/14 1109 12/04/14 1615 12/04/14 2102 12/05/14 0617  GLUCAP 167* 179* 186* 101* 174*     Signed:  Charlynne Cousins  [facility] 12/05/2014, 9:32 AM

## 2014-12-07 ENCOUNTER — Telehealth: Payer: Self-pay | Admitting: Interventional Cardiology

## 2014-12-07 DIAGNOSIS — I5031 Acute diastolic (congestive) heart failure: Secondary | ICD-10-CM

## 2014-12-07 NOTE — Telephone Encounter (Signed)
Returned pt call. Per pt d/c summary form [facility]. Pt is to have a bmet drawn on 8/26. Pt aware.Lab appt scheduled for 8/26.

## 2014-12-07 NOTE — Telephone Encounter (Signed)
New problem   Pt was discharged from hospital and was told to have labs on Friday, but there was no orders in system. Please advise pt.

## 2014-12-10 ENCOUNTER — Other Ambulatory Visit (INDEPENDENT_AMBULATORY_CARE_PROVIDER_SITE_OTHER): Payer: BC Managed Care – PPO

## 2014-12-10 DIAGNOSIS — I5031 Acute diastolic (congestive) heart failure: Secondary | ICD-10-CM | POA: Diagnosis not present

## 2014-12-10 LAB — BASIC METABOLIC PANEL
BUN: 50 mg/dL — ABNORMAL HIGH (ref 6–23)
CALCIUM: 10.2 mg/dL (ref 8.4–10.5)
CO2: 26 mEq/L (ref 19–32)
Chloride: 103 mEq/L (ref 96–112)
Creatinine, Ser: 1.27 mg/dL — ABNORMAL HIGH (ref 0.40–1.20)
GFR: 45.3 mL/min — AB (ref >60.00)
Glucose, Bld: 135 mg/dL — ABNORMAL HIGH (ref 70–99)
POTASSIUM: 4.2 meq/L (ref 3.5–5.1)
SODIUM: 137 meq/L (ref 135–145)

## 2014-12-15 ENCOUNTER — Telehealth: Payer: Self-pay

## 2014-12-15 NOTE — Telephone Encounter (Signed)
Pt and pt husband aware of lab results. Pt reports that she was not given an Rx for Spironolactone and was prescribed lasix  qd and lisinopril  qd at d/c from [facility]. Pt current med list reflects lasix  qd Pt sts that's he drinks 16oz of dandelion tea daily. Pt stopped lisinopril on her own after 5 days, because of side effects. Adv pt that I will fwd a message to Dr.Smith for diuretic dosage clarification and call back with his recommendation. Pt and pt husband verbalized understanding.

## 2014-12-15 NOTE — Telephone Encounter (Signed)
-----   Message from Lyn Records, MD sent at 12/11/2014  1:18 PM EDT ----- Decrease spironolactone to 12.5 mg daily

## 2014-12-15 NOTE — Telephone Encounter (Signed)
Called to give pt lab results and Dr.Smith's recommendation. Left message with pt husband for pt to call back

## 2014-12-15 NOTE — Telephone Encounter (Signed)
-----   Message from Henry W Smith, MD sent at 12/11/2014  1:18 PM EDT ----- Decrease spironolactone to 12.5 mg daily 

## 2014-12-16 NOTE — Telephone Encounter (Signed)
Pt and pt husband aware. Per Dr.Smith pt should continue lasix  qd. Stay off off lisinopril and spironolactone. Repeat bmet at scheduled f/u o/v on 9/30

## 2014-12-16 NOTE — Telephone Encounter (Signed)
-----   Message from Henry W Smith, MD sent at 12/11/2014  1:18 PM EDT ----- Decrease spironolactone to 12.5 mg daily 

## 2015-01-11 ENCOUNTER — Encounter: Payer: Self-pay | Admitting: Internal Medicine

## 2015-01-11 NOTE — Progress Notes (Signed)
This encounter was created in error - please disregard.

## 2015-01-12 NOTE — Progress Notes (Signed)
Cardiology Office Note   Date:  01/14/2015   ID:  Kimberly Hardin, DOB Mar 15, 1951, MRN [id number]  PCP:  Ezequiel Kayser, MD  Cardiologist:  Lesleigh Noe, MD   Chief Complaint  Patient presents with  . Coronary Artery Disease      History of Present Illness: Kimberly Hardin is a 64 y.o. female who presents for  Coronary artery disease,  LAD  BMstent 2011 and DES 2012 with circumflex  DEstent 2011 , diastolic heart failure, obesity, high-risk myocardial perfusion study 2016 but refused angiography , obstructive sleep apnea, and chronic kidney disease. Also history of essential hypertension, hyperlipidemia, and diabetes mellitus.   Recent hospitalization for angina and acute diastolic heart failure has been rationalize by the patient is being and extreme anxiety episode. Myocardial perfusion imaging demonstrated a high risk nuclear study. She refused cath. She prefers to think of this episode as being noncardiac. Medications prescribed at discharge have been discontinued and include furosemide, spironolactone, and lisinopril. Reason for discontinuation was not typical side effects and included altered memory and headache.   No episodes of chest discomfort , exertional dyspnea, palpitations, or syncope.  Past Medical History  Diagnosis Date  . MI (myocardial infarction)     2 stents  . Diabetes mellitus without complication   . Hypertension   . Hyperlipidemia   . Allergic rhinitis   . Obesity   . Asthma   . Neuropathy   . Constipation   . History of kidney stones   . History of renal failure   . Anemia   . OSA (obstructive sleep apnea)     USES NASAL PRONGS  . Anxiety     PANIC ATTACKS    Past Surgical History  Procedure Laterality Date  . Eye surgery  3 years ago    laser surgery  . Cystoscopy w/ ureteral stent placement Bilateral 08/12/2012    Procedure: CYSTOSCOPY WITH RETROGRADE PYELOGRAM/URETERAL STENT PLACEMENT;  Surgeon: Sebastian Ache, MD;  Location: [facility];   Service: Urology;  Laterality: Bilateral;  . Coronary angioplasty  2011/ 2012    CARDIAC STENTS X4  . Cystoscopy with retrograde pyelogram, ureteroscopy and stent placement Bilateral 10/15/2012    Procedure: BILATERAL URETERSCOPIC STONE MANIPULATION, LEFT URETERAL STONE BASKETRY, BILATERAL RETROGRADES AND STENT EXCHANGECHANGE;  Surgeon: Sebastian Ache, MD;  Location: [facility];  Service: Urology;  Laterality: Bilateral;  . Holmium laser application Bilateral 10/15/2012    Procedure: HOLMIUM LASER APPLICATION;  Surgeon: Sebastian Ache, MD;  Location: [facility];  Service: Urology;  Laterality: Bilateral;  . Left heart catheterization with coronary angiogram N/A 11/27/2012    Procedure: LEFT HEART CATHETERIZATION WITH CORONARY ANGIOGRAM;  Surgeon: Lesleigh Noe, MD;  Location: [facility];  Service: Cardiovascular;  Laterality: N/A;     Current Outpatient Prescriptions  Medication Sig Dispense Refill  . Alpha Lipoic Acid 200 MG CAPS Take 200 mg by mouth 2 (two) times daily.    . APPLE CIDER VINEGAR PO Take two (2) teaspoons by mouth daily.    Marland Kitchen aspirin 325 MG tablet Take 325 mg by mouth daily.     . Bilberry, Vaccinium myrtillus, 1000 MG CAPS Take 1,000 mg by mouth daily.    Marland Kitchen BIOTIN PO Take 10,000 mcg by mouth daily.    . Black Currant Seed Oil 500 MG CAPS Take 500 mg by mouth daily.    . carvedilol (COREG) 25 MG tablet TAKE 1 TABLET BY MOUTH TWICE DAILY 60 tablet 1  . Cholecalciferol (D3  DOTS) 2000 UNITS TBDP Take 2,000 Units by mouth daily.    . Coenzyme Q10 (COQ10) 200 MG CAPS Take 200 mg by mouth daily.    Marland Kitchen Hawthorn 565 MG CAPS Take 565 mg by mouth 2 (two) times daily.    . insulin aspart (NOVOLOG) 100 UNIT/ML injection Inject 16 Units into the skin 3 (three) times daily with meals.     . insulin glargine (LANTUS) 100 UNIT/ML injection Inject 26 Units into the skin 2 (two) times daily.     . Multiple Vitamin (MULTIVITAMIN) capsule Take 1 capsule by mouth daily.    . nitroGLYCERIN (NITROSTAT)  0.4 MG SL tablet Place 0.4 mg under the tongue every 5 (five) minutes as needed for chest pain.    . Omega-3 Fatty Acids (FISH OIL) 1000 MG CAPS Take 1 capsule by mouth daily.    Marland Kitchen OVER THE COUNTER MEDICATION Take 400 mg by mouth 2 (two) times daily. MED NAME: SPIRULINA    . Pomegranate 250 MG CAPS Take 250 mg by mouth 2 (two) times daily.    . Pyridoxine-Kelp-Lect-Vinegar (KLB6 PO) Take 1 tablet by mouth 2 (two) times daily.     Marland Kitchen QUERCETIN PO Take 2 capsules by mouth 2 (two) times daily.    . vitamin B-12 (CYANOCOBALAMIN) 1000 MCG tablet Take 1,000 mcg by mouth daily.    Marland Kitchen zinc gluconate 50 MG tablet Take 50 mg by mouth daily.     No current facility-administered medications for this visit.   Facility-Administered Medications Ordered in Other Visits  Medication Dose Route Frequency Alika Saladin Last Rate Last Dose  . gentamicin (GARAMYCIN) 80 mg in dextrose 5 % 50 mL IVPB  80 mg Intravenous Once Sebastian Ache, MD        Allergies:   Ace inhibitors; Flagyl; Ibuprofen; Iodine; Lasix; Levaquin; Minocycline; Morphine and related; Nickel; Prednisone; Statins; Sudafed; Toujeo solostar; Tylenol; Valium; Zoloft; Darvon; Librium; and Betadine    Social History:  The patient  reports that she has never smoked. She has never used smokeless tobacco. She reports that she does not drink alcohol or use illicit drugs.   Family History:  The patient's family history includes Breast cancer in her mother; COPD in her mother; Diabetes in her father, paternal grandfather, paternal grandmother, and sister; Heart failure in her father; Stomach cancer in her maternal grandfather.    ROS:  Please see the history of present illness.   Otherwise, review of systems are positive for  Extreme  attitude towards nonpharmacologic therapy , orthopnea, inability to stay on medical regimen , back discomfort, and medication noncompliance.   All other systems are reviewed and negative.    PHYSICAL EXAM: VS:  BP 102/64 mmHg   Pulse 61  Ht  (1.499 m)  Wt 104.835 kg (231 lb 1.9 oz)  BMI 46.66 kg/m2  SpO2 98% , BMI Body mass index is 46.66 kg/(m^2). GEN: Well nourished, well developed, in no acute distress HEENT: normal Neck: no JVD, carotid bruits, or masses Cardiac: RRR.  There  his no murmur, rub, or gallop. There is  no edema. Respiratory:  clear to auscultation bilaterally, normal work of breathing. GI: soft, nontender, nondistended, + BS MS: no deformity or atrophy Skin: warm and dry, no rash Neuro:  Strength and sensation are intact Psych: euthymic mood, full affect   EKG:  EKG  Is not ordered today.   Recent Labs: 12/02/2014: B Natriuretic Peptide 195.4*; Hemoglobin 11.7*; Platelets 189 12/10/2014: BUN 50*; Creatinine, Ser 1.27*; Potassium 4.2;  Sodium 137    Lipid Panel    Component Value Date/Time   CHOL 160 08/14/2012 0205   TRIG 78 08/14/2012 0205   HDL 40 08/14/2012 0205   CHOLHDL 4.0 08/14/2012 0205   VLDL 16 08/14/2012 0205   LDLCALC 104* 08/14/2012 0205      Wt Readings from Last 3 Encounters:  01/14/15 104.835 kg (231 lb 1.9 oz)  12/05/14 105 kg (231 lb 7.7 oz)  11/02/13 110.224 kg (243 lb)      Other studies Reviewed: Additional studies/ records that were reviewed today include:  Discharge summary reviewed.. The findings include  Data obtained included a high risk myocardial perfusion study..    ASSESSMENT AND PLAN:  1. Atherosclerosis of native coronary artery of native heart without angina pectoris  Suspect occlusion  of the LAD or circumflex, perhaps both. Discussed this with the patient. She refused cath after we documented a high risk myocardial perfusion study.  2. Essential hypertension  variable blood pressures have been noted. Needs  ACE/ ARB therapy but has discontinued the therapy.  3. Chronic diastolic heart failure  no evidence of volume overload.  4. Angina, class I  episodes of discomfort have resolved.    Current medicines are reviewed at  length with the patient today.  The patient has the following concerns regarding medicines: We discussed her current non-pharmacologic stance for significant underlying medical problems. I did not condone them and did try to explain the natural history of coronary disease will be dependent upon aggressive control of risk factors..  The following changes/actions have been instituted:     Nitroglycerin is refill   We discussed the absence of optimal medical therapy due to medication intolerance ( ACE inhibitor, spironolactone, and furosemide )  Cautioned that the recent hospital stay was likely due to occlusion of one of her stented vessels and not a "panic attack ". Advised that recurrent similar symptoms would require notification rather than complacency.   low-salt diet and volume restriction are reaffirmed.  Labs/ tests ordered today include:  No orders of the defined types were placed in this encounter.     Disposition:   FU with HS in 8 months  Signed, Lesleigh Noe, MD  01/14/2015 8:59 AM    [facility] [address] [place], [place], [place]  [address] Phone: [phone]; Fax: [phone]

## 2015-01-14 ENCOUNTER — Ambulatory Visit (INDEPENDENT_AMBULATORY_CARE_PROVIDER_SITE_OTHER): Payer: BC Managed Care – PPO | Admitting: Interventional Cardiology

## 2015-01-14 ENCOUNTER — Encounter: Payer: Self-pay | Admitting: Interventional Cardiology

## 2015-01-14 VITALS — BP 102/64 | HR 61 | Ht 59.0 in | Wt 231.1 lb

## 2015-01-14 DIAGNOSIS — I208 Other forms of angina pectoris: Secondary | ICD-10-CM | POA: Diagnosis not present

## 2015-01-14 DIAGNOSIS — I5032 Chronic diastolic (congestive) heart failure: Secondary | ICD-10-CM

## 2015-01-14 DIAGNOSIS — I10 Essential (primary) hypertension: Secondary | ICD-10-CM

## 2015-01-14 DIAGNOSIS — I2510 Atherosclerotic heart disease of native coronary artery without angina pectoris: Secondary | ICD-10-CM

## 2015-01-14 DIAGNOSIS — I209 Angina pectoris, unspecified: Secondary | ICD-10-CM

## 2015-01-14 NOTE — Patient Instructions (Signed)
Medication Instructions:  START Nitro-Glycerin use as directed. An Rx has been sent to your pharmacy   Labwork: None ordered    Testing/Procedures: None ordered    Follow-Up: Your physician wants you to follow-up in: 6-9 months with Dr.Smith You will receive a reminder letter in the mail two months in advance. If you don't receive a letter, please call our office to schedule the follow-up appointment.       Nitroglycerin sublingual tablets What is this medicine? NITROGLYCERIN (nye troe GLI ser in) is a type of vasodilator. It relaxes blood vessels, increasing the blood and oxygen supply to your heart. This medicine is used to relieve chest pain caused by angina. It is also used to prevent chest pain before activities like climbing stairs, going outdoors in cold weather, or sexual activity. This medicine may be used for other purposes; ask your health care provider or pharmacist if you have questions. COMMON BRAND NAME(S): Nitroquick, Nitrostat, Nitrotab What should I tell my health care provider before I take this medicine? They need to know if you have any of these conditions: -anemia -head injury, recent stroke, or bleeding in the brain -liver disease -previous heart attack -an unusual or allergic reaction to nitroglycerin, other medicines, foods, dyes, or preservatives -pregnant or trying to get pregnant -breast-feeding How should I use this medicine? Take this medicine by mouth as needed. At the first sign of an angina attack (chest pain or tightness) place one tablet under your tongue. You can also take this medicine 5 to 10 minutes before an event likely to produce chest pain. Follow the directions on the prescription label. Let the tablet dissolve under the tongue. Do not swallow whole. Replace the dose if you accidentally swallow it. It will help if your mouth is not dry. Saliva around the tablet will help it to dissolve more quickly. Do not eat or drink, smoke or chew  tobacco while a tablet is dissolving. If you are not better within 5 minutes after taking ONE dose of nitroglycerin, call 9-1-1 immediately to seek emergency medical care. Do not take more than 3 nitroglycerin tablets over 15 minutes. If you take this medicine often to relieve symptoms of angina, your doctor or health care professional may provide you with different instructions to manage your symptoms. If symptoms do not go away after following these instructions, it is important to call 9-1-1 immediately. Do not take more than 3 nitroglycerin tablets over 15 minutes. Talk to your pediatrician regarding the use of this medicine in children. Special care may be needed. Overdosage: If you think you have taken too much of this medicine contact a poison control center or emergency room at once. NOTE: This medicine is only for you. Do not share this medicine with others. What if I miss a dose? This does not apply. This medicine is only used as needed. What may interact with this medicine? Do not take this medicine with any of the following medications: -certain migraine medicines like ergotamine and dihydroergotamine (DHE) -medicines used to treat erectile dysfunction like sildenafil, tadalafil, and vardenafil -riociguat This medicine may also interact with the following medications: -alteplase -aspirin -heparin -medicines for high blood pressure -medicines for mental depression -other medicines used to treat angina -phenothiazines like chlorpromazine, mesoridazine, prochlorperazine, thioridazine This list may not describe all possible interactions. Give your health care provider a list of all the medicines, herbs, non-prescription drugs, or dietary supplements you use. Also tell them if you smoke, drink alcohol, or use illegal drugs.  Some items may interact with your medicine. What should I watch for while using this medicine? Tell your doctor or health care professional if you feel your medicine  is no longer working. Keep this medicine with you at all times. Sit or lie down when you take your medicine to prevent falling if you feel dizzy or faint after using it. Try to remain calm. This will help you to feel better faster. If you feel dizzy, take several deep breaths and lie down with your feet propped up, or bend forward with your head resting between your knees. You may get drowsy or dizzy. Do not drive, use machinery, or do anything that needs mental alertness until you know how this drug affects you. Do not stand or sit up quickly, especially if you are an older patient. This reduces the risk of dizzy or fainting spells. Alcohol can make you more drowsy and dizzy. Avoid alcoholic drinks. Do not treat yourself for coughs, colds, or pain while you are taking this medicine without asking your doctor or health care professional for advice. Some ingredients may increase your blood pressure. What side effects may I notice from receiving this medicine? Side effects that you should report to your doctor or health care professional as soon as possible: -blurred vision -dry mouth -skin rash -sweating -the feeling of extreme pressure in the head -unusually weak or tired Side effects that usually do not require medical attention (report to your doctor or health care professional if they continue or are bothersome): -flushing of the face or neck -headache -irregular heartbeat, palpitations -nausea, vomiting This list may not describe all possible side effects. Call your doctor for medical advice about side effects. You may report side effects to FDA at 1-800-FDA-1088. Where should I keep my medicine? Keep out of the reach of children. Store at room temperature between 20 and 25 degrees C (68 and 77 degrees F). Store in Retail buyer. Protect from light and moisture. Keep tightly closed. Throw away any unused medicine after the expiration date. NOTE: This sheet is a summary. It may not cover  all possible information. If you have questions about this medicine, talk to your doctor, pharmacist, or health care provider.  2015, [employer] Standard. (2013-01-29 17:57:36)     Any Other Special Instructions Will Be Listed Below (If Applicable).

## 2015-01-20 ENCOUNTER — Other Ambulatory Visit: Payer: Self-pay | Admitting: Interventional Cardiology

## 2015-02-03 DIAGNOSIS — E113392 Type 2 diabetes mellitus with moderate nonproliferative diabetic retinopathy without macular edema, left eye: Secondary | ICD-10-CM | POA: Insufficient documentation

## 2015-02-10 ENCOUNTER — Other Ambulatory Visit: Payer: Self-pay | Admitting: Interventional Cardiology

## 2015-05-13 ENCOUNTER — Encounter: Payer: Self-pay | Admitting: Interventional Cardiology

## 2015-05-31 IMAGING — CR DG CHEST 2V
2 series · 2 of 2 positions shown · non-contrast
Comparison: Chest x-ray dated 09/08/2010

CLINICAL DATA: Preoperative respiratory exam. Bilateral ureteral
stones.

CHEST - 2 VIEW

[w chest pa]
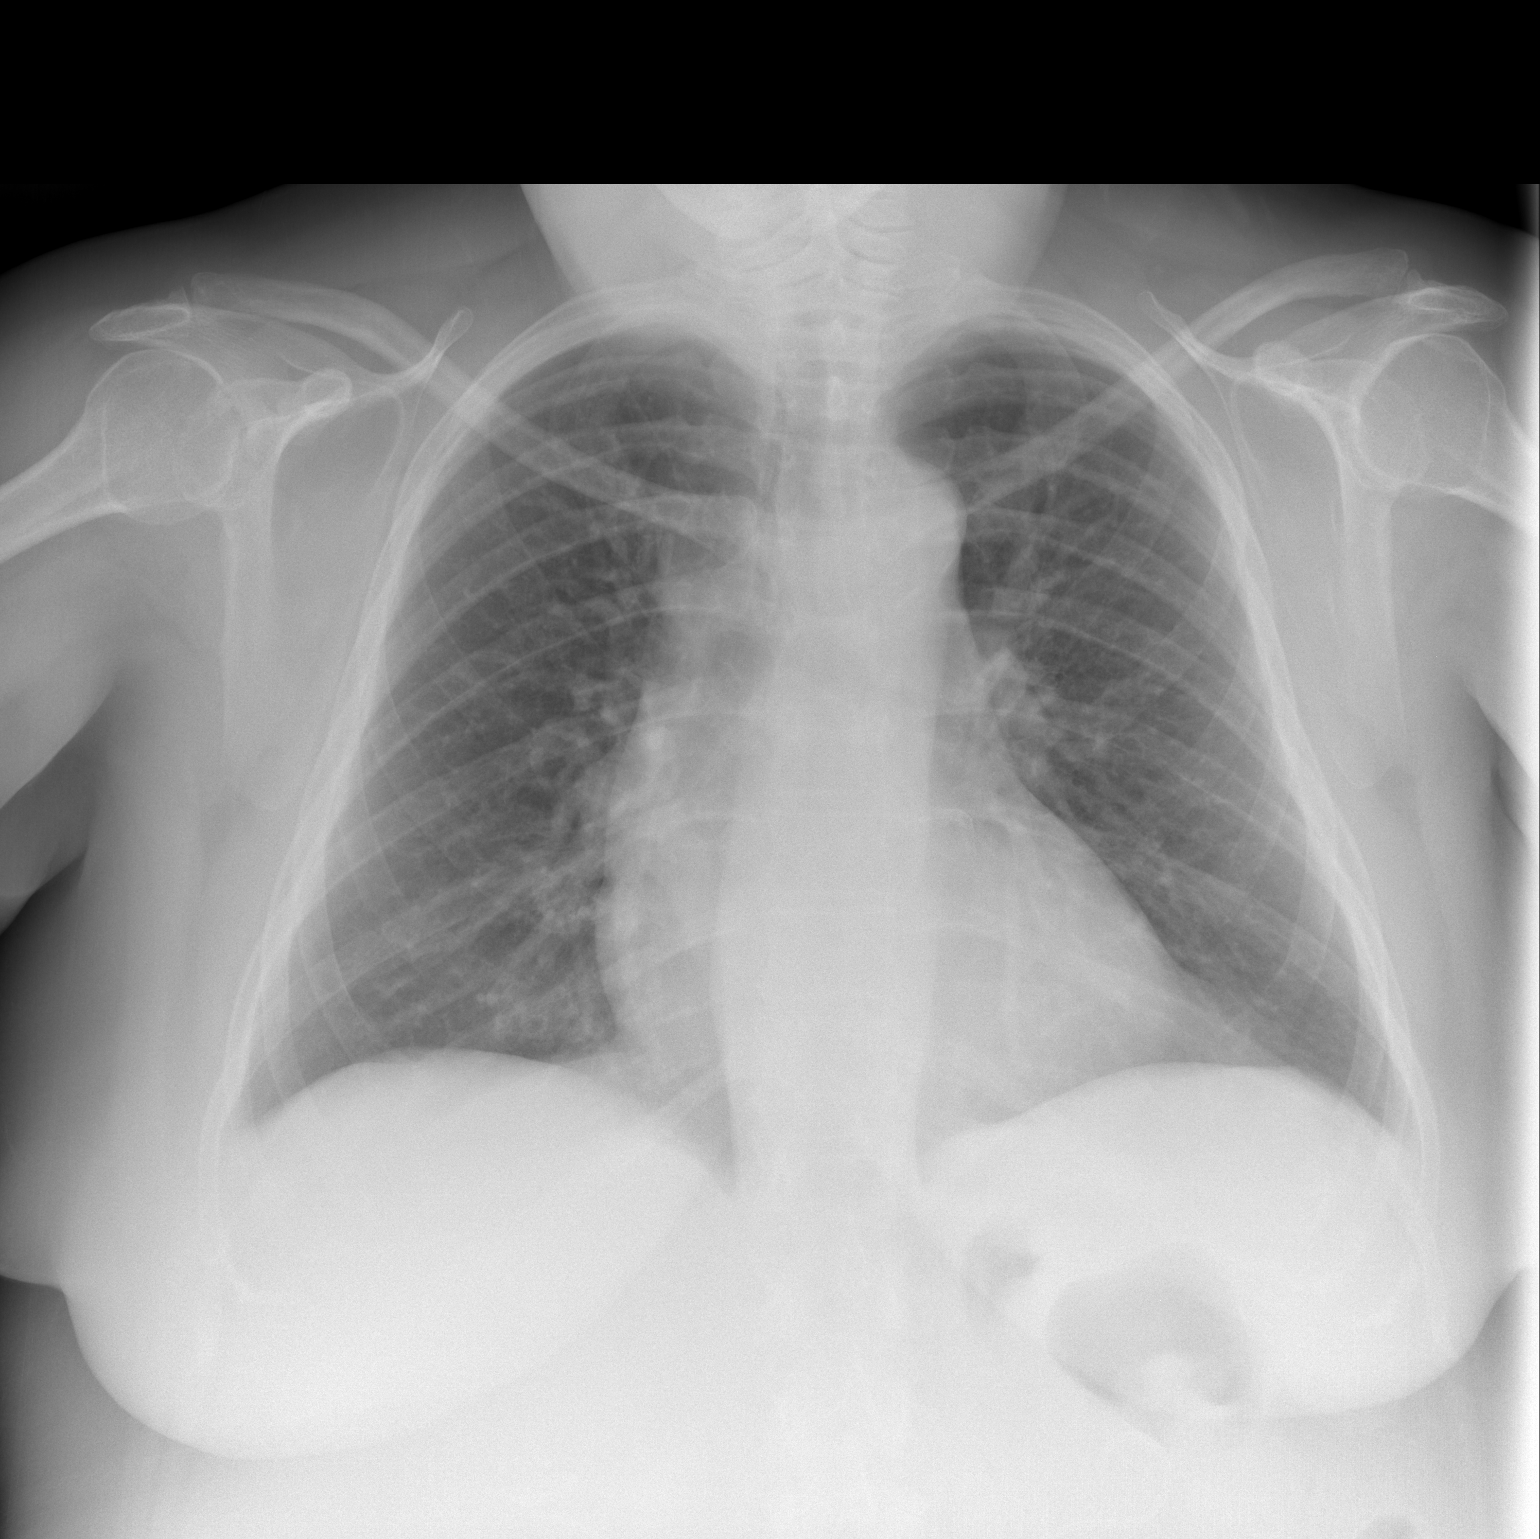

[w chest lat]
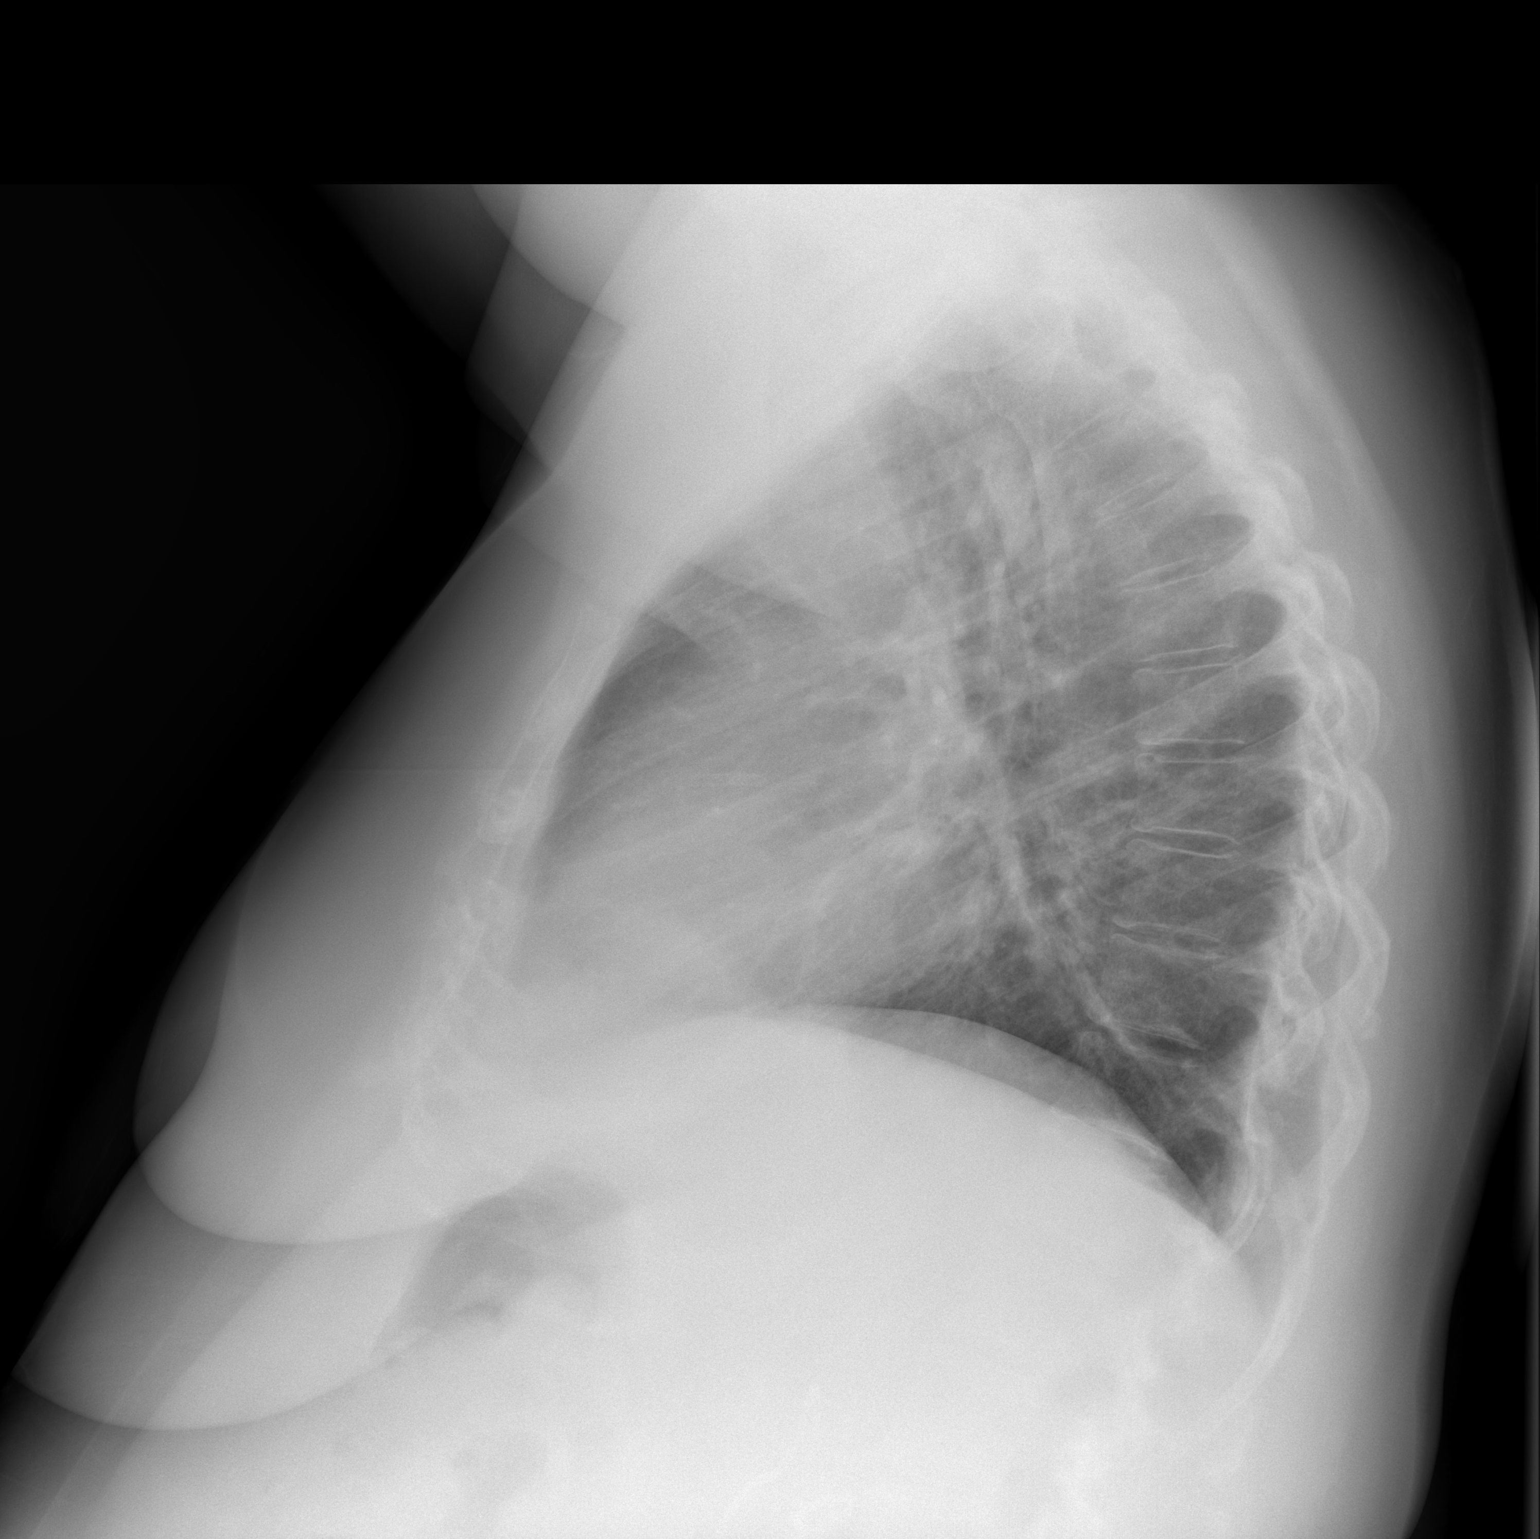

[2 of 2 positions shown; findings below may reference images not displayed]

FINDINGS: Heart size is normal.  There is slight pulmonary vascular
redistribution of the upper lobes.  The lungs are clear.  No
effusions.  No acute osseous abnormality.
IMPRESSION: Slight pulmonary vascular prominence.

## 2015-07-28 DIAGNOSIS — E113511 Type 2 diabetes mellitus with proliferative diabetic retinopathy with macular edema, right eye: Secondary | ICD-10-CM | POA: Insufficient documentation

## 2015-07-28 DIAGNOSIS — H35371 Puckering of macula, right eye: Secondary | ICD-10-CM | POA: Insufficient documentation

## 2015-09-19 ENCOUNTER — Other Ambulatory Visit: Payer: Self-pay | Admitting: Interventional Cardiology

## 2015-12-15 ENCOUNTER — Ambulatory Visit (INDEPENDENT_AMBULATORY_CARE_PROVIDER_SITE_OTHER): Payer: Medicare Other | Admitting: Interventional Cardiology

## 2015-12-15 ENCOUNTER — Encounter: Payer: Self-pay | Admitting: Interventional Cardiology

## 2015-12-15 VITALS — BP 154/62 | HR 69 | Ht 59.0 in | Wt 246.8 lb

## 2015-12-15 DIAGNOSIS — I2510 Atherosclerotic heart disease of native coronary artery without angina pectoris: Secondary | ICD-10-CM

## 2015-12-15 DIAGNOSIS — I5032 Chronic diastolic (congestive) heart failure: Secondary | ICD-10-CM | POA: Diagnosis not present

## 2015-12-15 DIAGNOSIS — I10 Essential (primary) hypertension: Secondary | ICD-10-CM | POA: Diagnosis not present

## 2015-12-15 DIAGNOSIS — G4733 Obstructive sleep apnea (adult) (pediatric): Secondary | ICD-10-CM | POA: Diagnosis not present

## 2015-12-15 DIAGNOSIS — N182 Chronic kidney disease, stage 2 (mild): Secondary | ICD-10-CM

## 2015-12-15 DIAGNOSIS — D509 Iron deficiency anemia, unspecified: Secondary | ICD-10-CM

## 2015-12-15 DIAGNOSIS — E785 Hyperlipidemia, unspecified: Secondary | ICD-10-CM

## 2015-12-15 NOTE — Patient Instructions (Signed)
Medication Instructions:  Your physician recommends that you continue on your current medications as directed. Please refer to the Current Medication list given to you today.   Labwork: None ordered  Testing/Procedures: None ordered  Follow-Up: Your physician wants you to follow-up in: 1 year with Dr.Smith You will receive a reminder letter in the mail two months in advance. If you don't receive a letter, please call our office to schedule the follow-up appointment.    Any Other Special Instructions Will Be Listed Below (If Applicable). Limit sodium in your diet  Try to stay active    If you need a refill on your cardiac medications before your next appointment, please call your pharmacy.

## 2015-12-15 NOTE — Progress Notes (Signed)
Cardiology Office Note    Date:  12/15/2015   ID:  Kimberly Hardin, DOB [birth year], MRN [id number]  PCP:  Ezequiel KayserPERINI,MARK A, MD  Cardiologist: Lesleigh NoeHenry W Smith III, MD   Chief Complaint  Patient presents with  . Coronary Artery Disease    History of Present Illness:  Kimberly KocherMarion Whyte is a 65 y.o. female with history of coronary disease with prior LAD and right coronary stents as well as history of in-stent restenosis, chronic diastolic heart failure, diabetes mellitus, morbid obesity, history of acute diastolic heart failure, and anxiety disorder.  No cardiopulmonary complaints. Has had difficulty following dietary and medication recommendations. Tends to use homeopathic therapy in preference to conventional therapy. Denies episodes of chest pain. No change in orthopnea. She sleeps in a recliner and has done so for greater than 10 years.  Past Medical History:  Diagnosis Date  . Allergic rhinitis   . Anemia   . Anxiety    PANIC ATTACKS  . Asthma   . Constipation   . Diabetes mellitus without complication (HCC)   . History of kidney stones   . History of renal failure   . Hyperlipidemia   . Hypertension   . MI (myocardial infarction) (HCC)    2 stents  . Neuropathy (HCC)   . Obesity   . OSA (obstructive sleep apnea)    USES NASAL PRONGS    Past Surgical History:  Procedure Laterality Date  . CORONARY ANGIOPLASTY  2011/ 2012   CARDIAC STENTS X4  . CYSTOSCOPY W/ URETERAL STENT PLACEMENT Bilateral 08/12/2012   Procedure: CYSTOSCOPY WITH RETROGRADE PYELOGRAM/URETERAL STENT PLACEMENT;  Surgeon: Sebastian Acheheodore Manny, MD;  Location: [facility];  Service: Urology;  Laterality: Bilateral;  . CYSTOSCOPY WITH RETROGRADE PYELOGRAM, URETEROSCOPY AND STENT PLACEMENT Bilateral 10/15/2012   Procedure: BILATERAL URETERSCOPIC STONE MANIPULATION, LEFT URETERAL STONE BASKETRY, BILATERAL RETROGRADES AND STENT EXCHANGECHANGE;  Surgeon: Sebastian Acheheodore Manny, MD;  Location: [facility];  Service: Urology;  Laterality:  Bilateral;  . EYE SURGERY  3 years ago   laser surgery  . HOLMIUM LASER APPLICATION Bilateral 10/15/2012   Procedure: HOLMIUM LASER APPLICATION;  Surgeon: Sebastian Acheheodore Manny, MD;  Location: [facility];  Service: Urology;  Laterality: Bilateral;  . LEFT HEART CATHETERIZATION WITH CORONARY ANGIOGRAM N/A 11/27/2012   Procedure: LEFT HEART CATHETERIZATION WITH CORONARY ANGIOGRAM;  Surgeon: Lesleigh NoeHenry W Smith III, MD;  [facility];  Service: Cardiovascular;  Laterality: N/A;    Current Medications: Outpatient Medications Prior to Visit  Medication Sig Dispense Refill  . Alpha Lipoic Acid 200 MG CAPS Take 200 mg by mouth 2 (two) times daily.    . APPLE CIDER VINEGAR PO Take two (2) teaspoons by mouth daily.    Marland Kitchen. aspirin 325 MG tablet Take 325 mg by mouth daily.     . Bilberry, Vaccinium myrtillus, 1000 MG CAPS Take 1,000 mg by mouth daily.    Marland Kitchen. BIOTIN PO Take 10,000 mcg by mouth daily.    . Black Currant Seed Oil 500 MG CAPS Take 500 mg by mouth daily.    . carvedilol (COREG) 25 MG tablet TAKE 1 TABLET BY MOUTH TWICE DAILY 60 tablet 2  . Cholecalciferol (D3 DOTS) 2000 UNITS TBDP Take 2,000 Units by mouth daily.    . Coenzyme Q10 (COQ10) 200 MG CAPS Take 200 mg by mouth daily.    Marland Kitchen. Hawthorn 565 MG CAPS Take 565 mg by mouth 2 (two) times daily.    . insulin glargine (LANTUS) 100 UNIT/ML injection Inject 26 Units into the  skin 2 (two) times daily.     . Multiple Vitamin (MULTIVITAMIN) capsule Take 1 capsule by mouth daily.    Marland Kitchen NITROSTAT 0.4 MG SL tablet TAKE 1 TABLET UNDER THE TONGUE AS NEEDEDFOR CHEST PAIN (MAY REPEAT EVERY 5 MIN X3 DOSES, CALL 911 IF NO RELIEF.) 25 tablet 5  . Omega-3 Fatty Acids (FISH OIL) 1000 MG CAPS Take 1 capsule by mouth daily.    Marland Kitchen OVER THE COUNTER MEDICATION Take 400 mg by mouth 2 (two) times daily. MED NAME: SPIRULINA    . Pomegranate 250 MG CAPS Take 250 mg by mouth 2 (two) times daily.    . Pyridoxine-Kelp-Lect-Vinegar (KLB6 PO) Take 1 tablet by mouth 2 (two) times daily.      Marland Kitchen QUERCETIN PO Take 2 capsules by mouth 2 (two) times daily.    . vitamin B-12 (CYANOCOBALAMIN) 1000 MCG tablet Take 1,000 mcg by mouth daily.    Marland Kitchen zinc gluconate 50 MG tablet Take 50 mg by mouth daily.    . insulin aspart (NOVOLOG) 100 UNIT/ML injection Inject 16 Units into the skin 3 (three) times daily with meals.      Facility-Administered Medications Prior to Visit  Medication Dose Route Frequency Provider Last Rate Last Dose  . gentamicin (GARAMYCIN) 80 mg in dextrose 5 % 50 mL IVPB  80 mg Intravenous Once Sebastian Ache, MD         Allergies:   Ace inhibitors; Flagyl [metronidazole]; Ibuprofen; Iodine; Lasix [furosemide]; Levaquin [levofloxacin in d5w]; Minocycline; Morphine and related; Nickel; Prednisone; Statins; Sudafed [pseudoephedrine hcl]; Toujeo solostar [insulin glargine]; Tylenol [acetaminophen]; Valium [diazepam]; Zoloft [sertraline hcl]; Darvon [propoxyphene hcl]; Librium [chlordiazepoxide]; Penicillins; and Betadine [povidone iodine]   Social History   Social History  . Marital status: Married    Spouse name: Casimiro Needle  . Number of children: N/A  . Years of education: N/A   Occupational History  . [employer] Unemployed   Social History Main Topics  . Smoking status: Never Smoker  . Smokeless tobacco: Never Used  . Alcohol use No  . Drug use: No  . Sexual activity: Not Asked   Other Topics Concern  . None   Social History Narrative  . None     Family History:  The patient's family history includes Breast cancer in her mother; COPD in her mother; Diabetes in her father, paternal grandfather, paternal grandmother, and sister; Heart failure in her father; Stomach cancer in her maternal grandfather.   ROS:   Please see the history of present illness.    Chronic sinus infection. Recently started Repatha. Has difficulty affording her medication regimen.  All other systems reviewed and are negative.   PHYSICAL EXAM:   VS:  BP (!) 154/62   Pulse 69   Ht  4\' 11"  (1.499 m)   Wt 246 lb 12.8 oz (111.9 kg)   BMI 49.85 kg/m    GEN: Well nourished, well developed, in no acute distress  HEENT: normal  Neck: no JVD, carotid bruits, or masses Cardiac: RRR; no murmurs, rubs, or gallops But trace bilateral ankle edema.  Respiratory:  clear to auscultation bilaterally, normal work of breathing GI: soft, nontender, nondistended, + BS MS: no deformity or atrophy  Skin: warm and dry, no rash Neuro:  Alert and Oriented x 3, Strength and sensation are intact Psych: euthymic mood, full affect  Wt Readings from Last 3 Encounters:  12/15/15 246 lb 12.8 oz (111.9 kg)  01/14/15 231 lb 1.9 oz (104.8 kg)  12/05/14 231 lb  7.7 oz (105 kg)      Studies/Labs Reviewed:   EKG:  EKG  Normal sinus rhythm with left ventricular hypertrophy and strain. Q waves V1 and V2. No significant change when compared to prior.  Recent Labs: No results found for requested labs within last 8760 hours.   Lipid Panel    Component Value Date/Time   CHOL 160 08/14/2012 0205   TRIG 78 08/14/2012 0205   HDL 40 08/14/2012 0205   CHOLHDL 4.0 08/14/2012 0205   VLDL 16 08/14/2012 0205   LDLCALC 104 (H) 08/14/2012 0205    Additional studies/ records that were reviewed today include:  No new data is available. Metabolic status (lipids, diabetes, kidney function, etc.) are followed by Dr. Waynard Edwards.    ASSESSMENT:    1. Essential hypertension   2. Atherosclerosis of native coronary artery of native heart without angina pectoris   3. Chronic diastolic HF (heart failure) (HCC)   4. Obstructive sleep apnea   5. CKD (chronic kidney disease), stage II   6. HLD (hyperlipidemia)   7. Anemia, iron deficiency      PLAN:  In order of problems listed above:  1. Low salt diet and weight loss we discussed 2. Plant based diet/modified, hydrate diet. 3. 2 g sodium diet 4. Not addressed 5. Followed by primary 6. Followed by primary    Medication Adjustments/Labs and Tests  Ordered: Current medicines are reviewed at length with the patient today.  Concerns regarding medicines are outlined above.  Medication changes, Labs and Tests ordered today are listed in the Patient Instructions below. There are no Patient Instructions on file for this visit.   Signed, Lesleigh Noe, MD  12/15/2015 11:31 AM    [facility] [address] [place], [place], [place]  [address] Phone: [phone]; Fax: [phone]

## 2016-01-04 ENCOUNTER — Other Ambulatory Visit: Payer: Self-pay | Admitting: Interventional Cardiology

## 2017-01-21 ENCOUNTER — Encounter: Payer: Self-pay | Admitting: Interventional Cardiology

## 2017-01-21 ENCOUNTER — Other Ambulatory Visit: Payer: Self-pay | Admitting: Interventional Cardiology

## 2017-01-23 NOTE — Telephone Encounter (Signed)
Follow up     *STAT* If patient is at the pharmacy, call can be transferred to refill team.   1. Which medications need to be refilled? (please list name of each medication and dose if known) carvedilol (COREG) 25 MG tablet  2. Which pharmacy/location (including street and city if local pharmacy) is medication to be sent to? [facility]  3. Do they need a 30 day or 90 day supply? 30

## 2017-01-31 ENCOUNTER — Ambulatory Visit (INDEPENDENT_AMBULATORY_CARE_PROVIDER_SITE_OTHER): Payer: Medicare Other | Admitting: Interventional Cardiology

## 2017-01-31 ENCOUNTER — Encounter: Payer: Self-pay | Admitting: Interventional Cardiology

## 2017-01-31 VITALS — BP 132/72 | HR 56 | Ht 59.0 in | Wt 239.6 lb

## 2017-01-31 DIAGNOSIS — I25119 Atherosclerotic heart disease of native coronary artery with unspecified angina pectoris: Secondary | ICD-10-CM | POA: Diagnosis not present

## 2017-01-31 DIAGNOSIS — I5032 Chronic diastolic (congestive) heart failure: Secondary | ICD-10-CM | POA: Diagnosis not present

## 2017-01-31 DIAGNOSIS — G4733 Obstructive sleep apnea (adult) (pediatric): Secondary | ICD-10-CM

## 2017-01-31 DIAGNOSIS — I10 Essential (primary) hypertension: Secondary | ICD-10-CM

## 2017-01-31 DIAGNOSIS — Z794 Long term (current) use of insulin: Secondary | ICD-10-CM | POA: Diagnosis not present

## 2017-01-31 DIAGNOSIS — E1129 Type 2 diabetes mellitus with other diabetic kidney complication: Secondary | ICD-10-CM

## 2017-01-31 MED ORDER — CARVEDILOL 25 MG PO TABS: 25.0000 mg | ORAL_TABLET | Freq: Two times a day (BID) | ORAL | 3 refills | Status: DC

## 2017-01-31 MED ORDER — ASPIRIN EC 81 MG PO TBEC: 81.0000 mg | DELAYED_RELEASE_TABLET | Freq: Every day | ORAL | 3 refills | Status: AC

## 2017-01-31 NOTE — Patient Instructions (Signed)
Medication Instructions:  1) DECREASE Aspirin to 81mg once daily  Labwork: None  Testing/Procedures: None  Follow-Up: Your physician wants you to follow-up in: 1 year with Dr. Smith.  You will receive a reminder letter in the mail two months in advance. If you don't receive a letter, please call our office to schedule the follow-up appointment.   Any Other Special Instructions Will Be Listed Below (If Applicable).     If you need a refill on your cardiac medications before your next appointment, please call your pharmacy.   

## 2017-01-31 NOTE — Progress Notes (Signed)
Cardiology Office Note    Date:  01/31/2017   ID:  Kimberly KocherMarion Habig, DOB March 23, 1951, MRN [id number]  PCP:  Rodrigo RanPerini, Mark, MD  Cardiologist: Lesleigh NoeHenry W Smith III, MD   Chief Complaint  Patient presents with  . Coronary Artery Disease    History of Present Illness:  Kimberly Hardin is a 66 y.o. female with history of coronary disease with prior LAD and right coronary stents (2014) as well as history of in-stent restenosis, chronic diastolic heart failure, diabetes mellitus, morbid obesity, history of acute diastolic heart failure, and anxiety disorder.  Her father died this summer. She has had recurring episodes of "panic". These episodes are sudden onset of shortness of breath. She denies orthopnea. The panic attacks are not precipitated by activity. She has lost 12 pounds. She has not used nitroglycerin. She denies lower extremity swelling. She is compliant with her current medication regimen. Risk factor modification has been very difficult to accomplish due to multiple medication intolerances.   Past Medical History:  Diagnosis Date  . Allergic rhinitis   . Anemia   . Anxiety    PANIC ATTACKS  . Asthma   . Constipation   . Diabetes mellitus without complication (HCC)   . History of kidney stones   . History of renal failure   . Hyperlipidemia   . Hypertension   . MI (myocardial infarction) (HCC)    2 stents  . Neuropathy   . Obesity   . OSA (obstructive sleep apnea)    USES NASAL PRONGS    Past Surgical History:  Procedure Laterality Date  . CORONARY ANGIOPLASTY  2011/ 2012   CARDIAC STENTS X4  . CYSTOSCOPY W/ URETERAL STENT PLACEMENT Bilateral 08/12/2012   Procedure: CYSTOSCOPY WITH RETROGRADE PYELOGRAM/URETERAL STENT PLACEMENT;  Surgeon: Sebastian Acheheodore Manny, MD;  Location: [facility];  Service: Urology;  Laterality: Bilateral;  . CYSTOSCOPY WITH RETROGRADE PYELOGRAM, URETEROSCOPY AND STENT PLACEMENT Bilateral 10/15/2012   Procedure: BILATERAL URETERSCOPIC STONE MANIPULATION, LEFT  URETERAL STONE BASKETRY, BILATERAL RETROGRADES AND STENT EXCHANGECHANGE;  Surgeon: Sebastian Acheheodore Manny, MD;  Location: [facility];  Service: Urology;  Laterality: Bilateral;  . EYE SURGERY  3 years ago   laser surgery  . HOLMIUM LASER APPLICATION Bilateral 10/15/2012   Procedure: HOLMIUM LASER APPLICATION;  Surgeon: Sebastian Acheheodore Manny, MD;  Location: [facility];  Service: Urology;  Laterality: Bilateral;  . LEFT HEART CATHETERIZATION WITH CORONARY ANGIOGRAM N/A 11/27/2012   Procedure: LEFT HEART CATHETERIZATION WITH CORONARY ANGIOGRAM;  Surgeon: Lesleigh NoeHenry W Smith III, MD;  [facility];  Service: Cardiovascular;  Laterality: N/A;    Current Medications: Outpatient Medications Prior to Visit  Medication Sig Dispense Refill  . APPLE CIDER VINEGAR PO Take two (2) teaspoons by mouth daily.    Marland Kitchen. BIOTIN PO Take 10,000 mcg by mouth daily.    . Black Currant Seed Oil 500 MG CAPS Take 500 mg by mouth daily.    . Cholecalciferol (D3 DOTS) 2000 UNITS TBDP Take 2,000 Units by mouth daily.    . Coenzyme Q10 (COQ10) 200 MG CAPS Take 200 mg by mouth daily.    Marland Kitchen. Hawthorn 565 MG CAPS Take 565 mg by mouth 2 (two) times daily.    Marland Kitchen. HUMALOG KWIKPEN 100 UNIT/ML KiwkPen Inject 50 Units into the skin daily.    . insulin glargine (LANTUS) 100 UNIT/ML injection Inject 26 Units into the skin 2 (two) times daily.     Marland Kitchen. LORazepam (ATIVAN) 0.5 MG tablet Take 0.5 mg by mouth 3 (three) times daily as  needed for anxiety.    . Multiple Vitamin (MULTIVITAMIN) capsule Take 1 capsule by mouth daily.    Marland Kitchen NITROSTAT 0.4 MG SL tablet TAKE 1 TABLET UNDER THE TONGUE AS NEEDEDFOR CHEST PAIN (MAY REPEAT EVERY 5 MIN X3 DOSES, CALL 911 IF NO RELIEF.) 25 tablet 5  . Omega-3 Fatty Acids (FISH OIL) 1000 MG CAPS Take 1 capsule by mouth daily.    Marland Kitchen OVER THE COUNTER MEDICATION Take 400 mg by mouth 2 (two) times daily. MED NAME: SPIRULINA    . Pyridoxine-Kelp-Lect-Vinegar (KLB6 PO) Take 1 tablet by mouth 2 (two) times daily.     Marland Kitchen QUERCETIN PO Take 2  capsules by mouth 2 (two) times daily.    . vitamin B-12 (CYANOCOBALAMIN) 1000 MCG tablet Take 1,000 mcg by mouth daily.    Marland Kitchen zinc gluconate 50 MG tablet Take 50 mg by mouth daily.    Marland Kitchen aspirin 325 MG tablet Take 325 mg by mouth daily.     . carvedilol (COREG) 25 MG tablet TAKE 1 TABLET BY MOUTH TWICE DAILY 60 tablet 0  . albuterol (PROVENTIL HFA;VENTOLIN HFA) 108 (90 Base) MCG/ACT inhaler Inhale 1-2 puffs into the lungs every 4 (four) hours as needed for wheezing or shortness of breath.    Allen Kell Lipoic Acid 200 MG CAPS Take 200 mg by mouth 2 (two) times daily.    . Bilberry, Vaccinium myrtillus, 1000 MG CAPS Take 1,000 mg by mouth daily.    . cefdinir (OMNICEF) 300 MG capsule Take 300 mg by mouth 2 (two) times daily.    . Pomegranate 250 MG CAPS Take 250 mg by mouth 2 (two) times daily.     Facility-Administered Medications Prior to Visit  Medication Dose Route Frequency Provider Last Rate Last Dose  . gentamicin (GARAMYCIN) 80 mg in dextrose 5 % 50 mL IVPB  80 mg Intravenous Once Sebastian Ache, MD         Allergies:   Ace inhibitors; Flagyl [metronidazole]; Ibuprofen; Iodine; Lasix [furosemide]; Levaquin [levofloxacin in d5w]; Minocycline; Morphine and related; Nickel; Prednisone; Statins; Sudafed [pseudoephedrine hcl]; Toujeo solostar [insulin glargine]; Tylenol [acetaminophen]; Valium [diazepam]; Zoloft [sertraline hcl]; Darvon [propoxyphene hcl]; Librium [chlordiazepoxide]; Fluconazole; Penicillins; and Betadine [povidone iodine]   Social History   Social History  . Marital status: Married    Spouse name: Casimiro Needle  . Number of children: N/A  . Years of education: N/A   Occupational History  . [employer] Unemployed   Social History Main Topics  . Smoking status: Never Smoker  . Smokeless tobacco: Never Used  . Alcohol use No  . Drug use: No  . Sexual activity: Not Asked   Other Topics Concern  . None   Social History Narrative  . None     Family History:  The  patient's family history includes Breast cancer in her mother; COPD in her mother; Diabetes in her father, paternal grandfather, paternal grandmother, and sister; Heart failure in her father; Stomach cancer in her maternal grandfather.   ROS:   Please see the history of present illness.    Recent depression and anxiety related to the death of her father. Constipation, difficulty with balance. Back discomfort.  All other systems reviewed and are negative.   PHYSICAL EXAM:   VS:  BP 132/72 (BP Location: Left Arm)   Pulse (!) 56   Ht 4\' 11"  (1.499 m)   Wt 239 lb 9.6 oz (108.7 kg)   BMI 48.39 kg/m    GEN: Well nourished, well developed,  in no acute distress . Morbid obesity HEENT: normal  Neck: no JVD, carotid bruits, or masses Cardiac: RRR; no murmurs, rubs, or gallops,no edema  Respiratory:  clear to auscultation bilaterally, normal work of breathing GI: soft, nontender, nondistended, + BS MS: no deformity or atrophy  Skin: warm and dry, no rash Neuro:  Alert and Oriented x 3, Strength and sensation are intact Psych: euthymic mood, full affect  Wt Readings from Last 3 Encounters:  01/31/17 239 lb 9.6 oz (108.7 kg)  12/15/15 246 lb 12.8 oz (111.9 kg)  01/14/15 231 lb 1.9 oz (104.8 kg)      Studies/Labs Reviewed:   EKG:  EKG  Sinus bradycardia, diffuse T-wave inversion, QS pattern V1 and V2, and when compared to prior tracings, heart rate is slower but otherwise unchanged.  Recent Labs: No results found for requested labs within last 8760 hours.   Lipid Panel    Component Value Date/Time   CHOL 160 08/14/2012 0205   TRIG 78 08/14/2012 0205   HDL 40 08/14/2012 0205   CHOLHDL 4.0 08/14/2012 0205   VLDL 16 08/14/2012 0205   LDLCALC 104 (H) 08/14/2012 0205    Additional studies/ records that were reviewed today include:  Most recent LDL with Dr. Sherrye Payor was 166. Hemoglobin A1c in August was 9.5.    ASSESSMENT:    1. Coronary artery disease involving native coronary  artery of native heart with angina pectoris (HCC)   2. Chronic diastolic HF (heart failure) (HCC)   3. Essential hypertension   4. Obstructive sleep apnea   5. Type 2 diabetes mellitus with other diabetic kidney complication, with long-term current use of insulin (HCC)      PLAN:  In order of problems listed above:  1. Significant underlying coronary disease. Clinically stable. I cautioned patient that "anxiety/panic" is suspicious to me and we need to be certain not to attribute an ischemic symptom to anxiety. She ends stands. We will follow clinically. 2. No evidence of volume overload. 3. Well controlled blood pressure with target 130/85 mmHg or less. 4. Not addressed 5. Hemoglobin A1c target less than 7  CLINICAL F/U 1 year  Medication Adjustments/Labs and Tests Ordered: Current medicines are reviewed at length with the patient today.  Concerns regarding medicines are outlined above.  Medication changes, Labs and Tests ordered today are listed in the Patient Instructions below. Patient Instructions  Medication Instructions:  1) DECREASE Aspirin to 81mg  once daily  Labwork: None  Testing/Procedures: None  Follow-Up: Your physician wants you to follow-up in: 1 year with Dr. Katrinka Blazing.  You will receive a reminder letter in the mail two months in advance. If you don't receive a letter, please call our office to schedule the follow-up appointment.   Any Other Special Instructions Will Be Listed Below (If Applicable).     If you need a refill on your cardiac medications before your next appointment, please call your pharmacy.      Signed, Lesleigh Noe, MD  01/31/2017 9:01 AM    [facility] [address] [place], [place], [place]  [address] Phone: [phone]; Fax: [phone]

## 2017-05-24 ENCOUNTER — Ambulatory Visit: Payer: Medicare Other | Admitting: Podiatry

## 2017-05-24 ENCOUNTER — Encounter: Payer: Self-pay | Admitting: Podiatry

## 2017-05-24 VITALS — BP 147/75 | HR 59 | Resp 16 | Ht 58.75 in | Wt 247.0 lb

## 2017-05-24 DIAGNOSIS — B351 Tinea unguium: Secondary | ICD-10-CM

## 2017-05-24 DIAGNOSIS — L84 Corns and callosities: Secondary | ICD-10-CM

## 2017-05-24 DIAGNOSIS — E1149 Type 2 diabetes mellitus with other diabetic neurological complication: Secondary | ICD-10-CM | POA: Diagnosis not present

## 2017-05-24 DIAGNOSIS — E1140 Type 2 diabetes mellitus with diabetic neuropathy, unspecified: Secondary | ICD-10-CM | POA: Diagnosis not present

## 2017-05-24 DIAGNOSIS — M79675 Pain in left toe(s): Secondary | ICD-10-CM

## 2017-05-24 DIAGNOSIS — M79674 Pain in right toe(s): Secondary | ICD-10-CM

## 2017-05-24 NOTE — Progress Notes (Signed)
   Subjective:    Patient ID: Kimberly Hardin, female    DOB: 1950/12/09, 67 y.o.   MRN: [id number]  HPI    Review of Systems  All other systems reviewed and are negative.      Objective:   Physical Exam        Assessment & Plan:

## 2017-05-24 NOTE — Progress Notes (Signed)
Subjective:   Patient ID: Kimberly KocherMarion Hardin, female   DOB: 67 y.o.   MRN: [id number]   HPI Patient presents poor health with long-term diabetes not in good control with nail disease 1-5 both feet that are incurvated thick and she cannot cut keratotic lesions hallux bilateral and is generalized foot pain with multiple signs of discomfort with palpation   Review of Systems  All other systems reviewed and are negative.       Objective:  Physical Exam  Constitutional: She appears well-developed and well-nourished.  Cardiovascular: Intact distal pulses.  Pulmonary/Chest: Effort normal.  Musculoskeletal: Normal range of motion.  Neurological: She is alert.  Skin: Skin is warm.  Nursing note and vitals reviewed.   Neurovascular status found to be intact with muscle strength found to be diminished.  I did note diminishment of sharp dull vibratory and vascular status while intact there is definite weakness of the PT and DP pulses.  Upon questioning she does not appear to have any kind of claudication symptoms currently.  Patient is noted to have nail disease 1-5 both feet with yellow subungual debris incurvation of the corners with pain and lesions hallux bilateral with structural changes of the big toe bilateral     Assessment:  At risk diabetic with nail disease and pain 1-5 both feet and lesions bilateral and generalized foot pain with hammertoe deformity     Plan:  H&P all conditions reviewed.  I did want to do ABIs but she denied wanting them today due to having had a spider bite on her left ankle and will do at one point future.  I did discuss claudication with her and symptoms to watch out for and today I debrided nailbeds 1-5 both feet and lesion bilateral hallux with no iatrogenic bleeding and discussed long-term diabetic shoes.  We will get approval for diabetic shoes for this patient

## 2017-05-28 ENCOUNTER — Other Ambulatory Visit: Payer: Medicare Other

## 2017-07-02 ENCOUNTER — Ambulatory Visit (INDEPENDENT_AMBULATORY_CARE_PROVIDER_SITE_OTHER): Payer: Medicare Other | Admitting: Orthotics

## 2017-07-02 DIAGNOSIS — L84 Corns and callosities: Secondary | ICD-10-CM

## 2017-07-02 DIAGNOSIS — E1140 Type 2 diabetes mellitus with diabetic neuropathy, unspecified: Secondary | ICD-10-CM

## 2017-07-02 DIAGNOSIS — E1149 Type 2 diabetes mellitus with other diabetic neurological complication: Secondary | ICD-10-CM

## 2017-07-02 NOTE — Progress Notes (Signed)

## 2017-07-23 IMAGING — CR DG CHEST 2V
2 series · 2 of 2 positions shown · non-contrast
Comparison: PA and lateral chest 10/09/2012.

CLINICAL DATA: Shortness of breath beginning last night.

EXAM:
CHEST  2 VIEW

[chest pa]
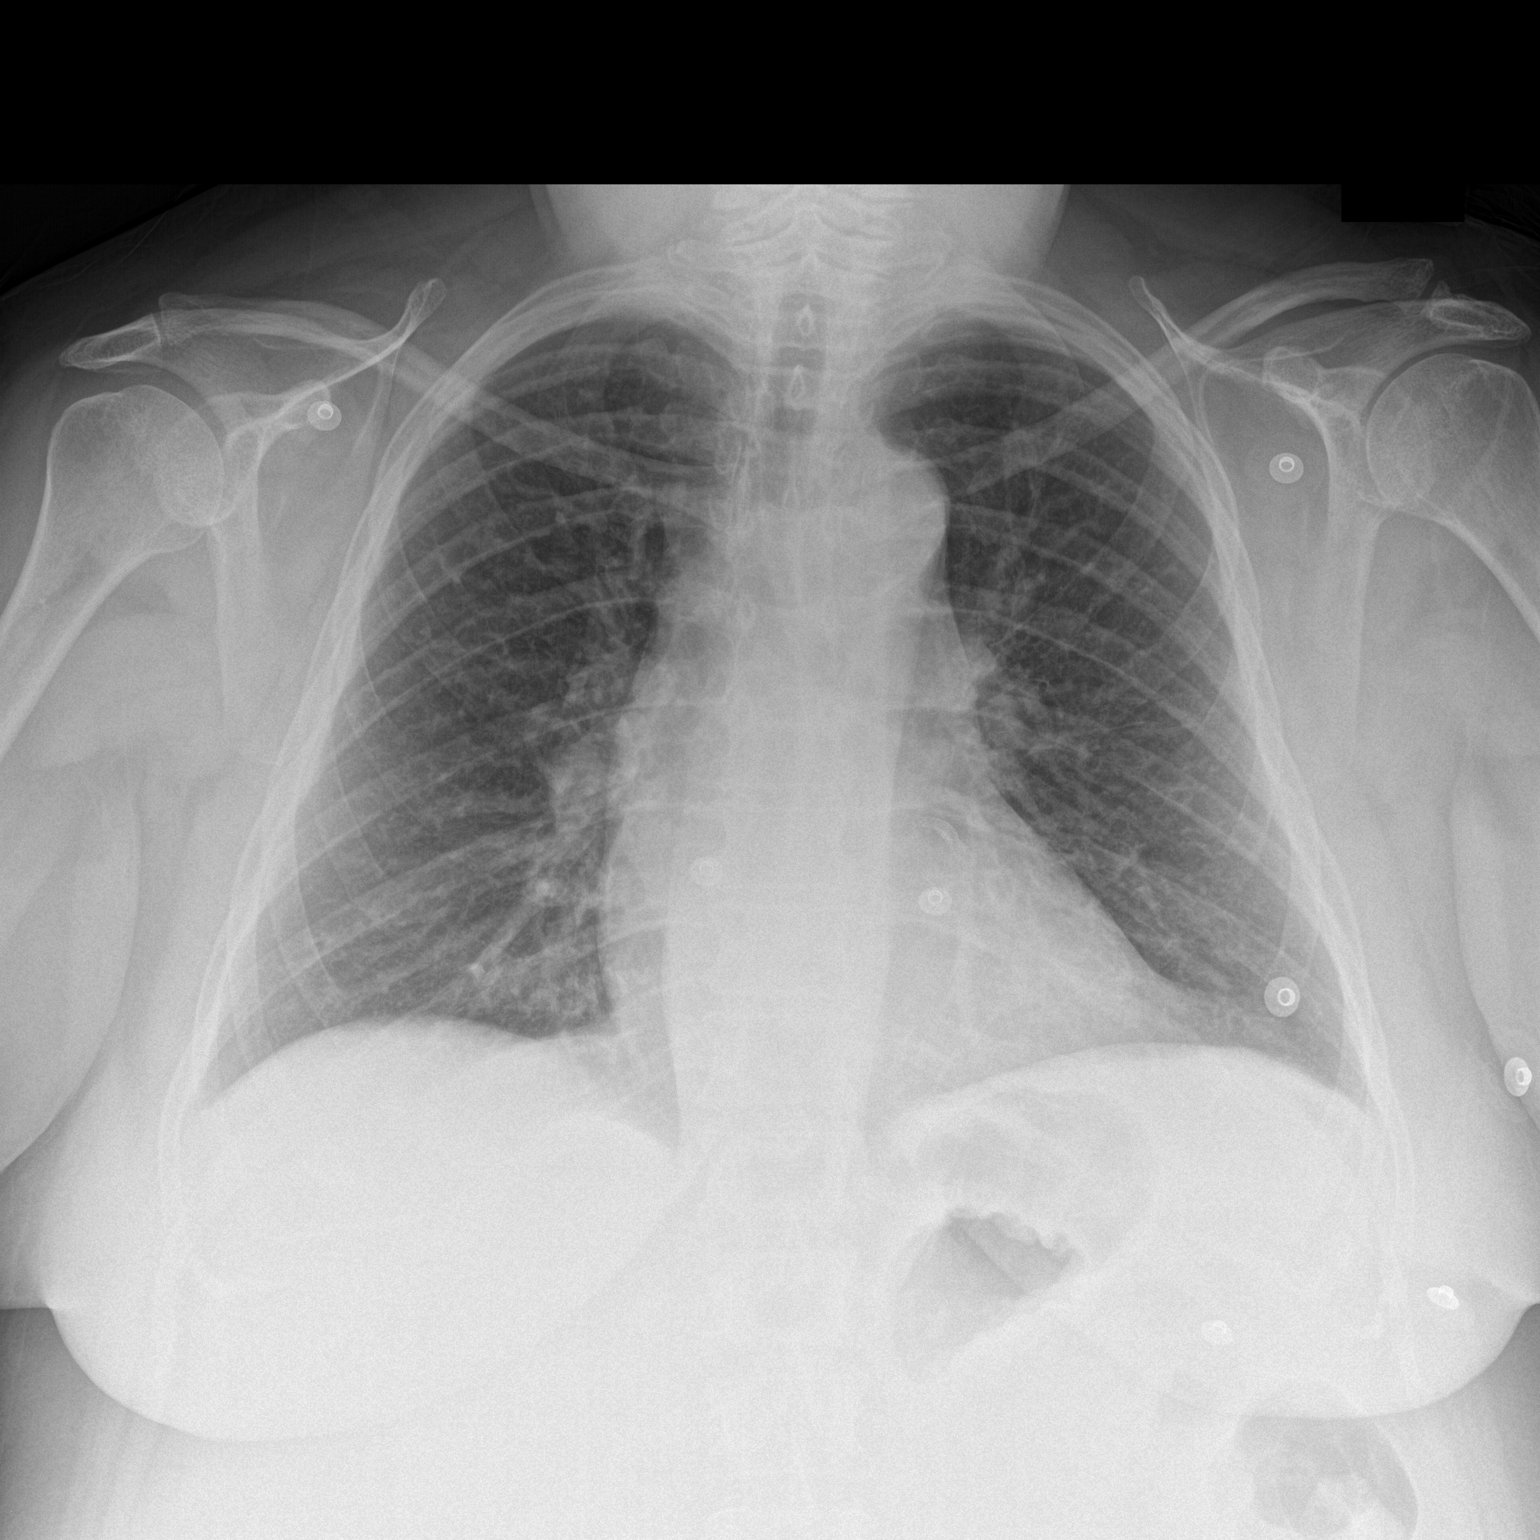

[chest lat]
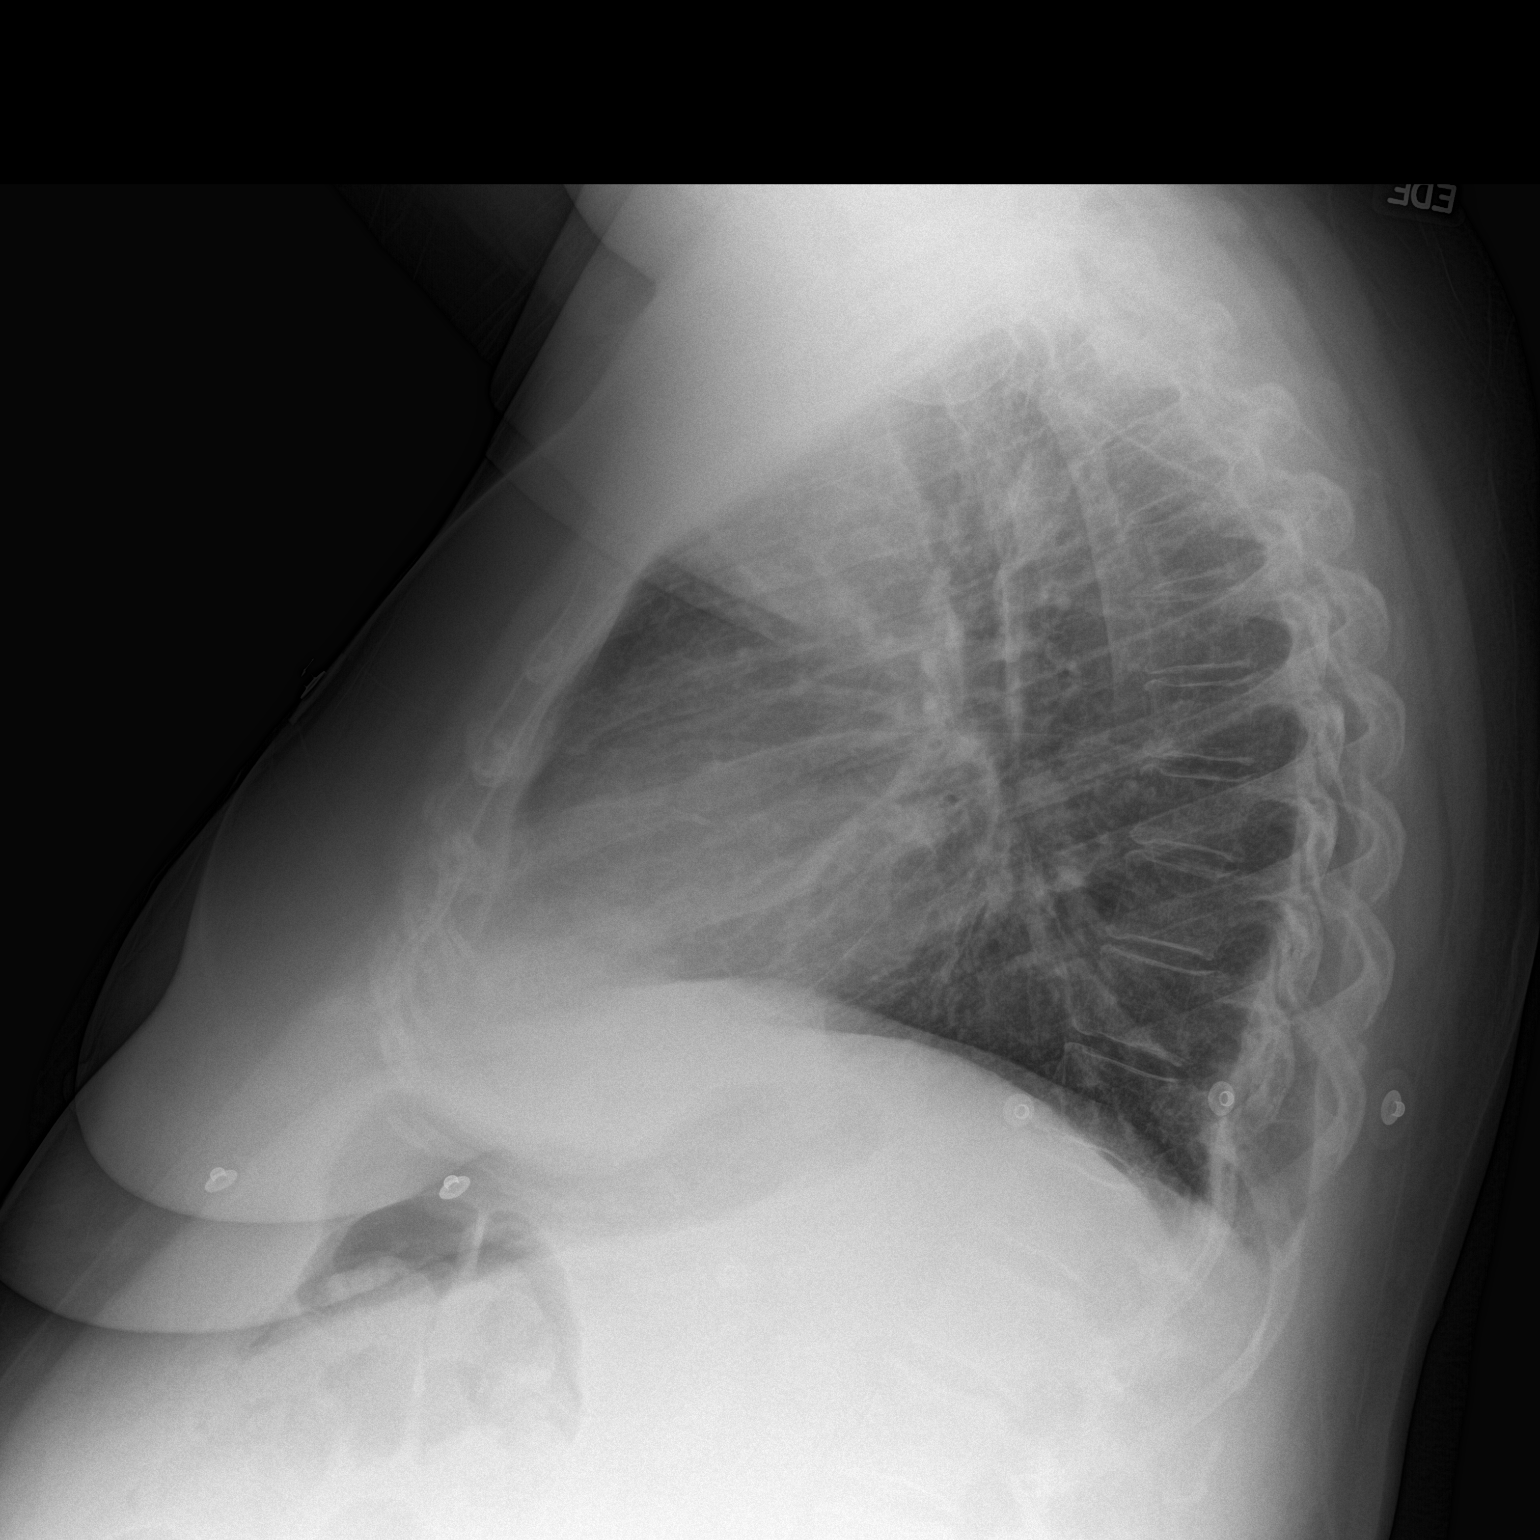

[2 of 2 positions shown; findings below may reference images not displayed]

FINDINGS: The lungs are clear. Heart size is normal. No pneumothorax or
pleural effusion.
IMPRESSION: No acute disease.

## 2017-08-23 ENCOUNTER — Ambulatory Visit: Payer: Medicare Other | Admitting: Podiatry

## 2017-08-23 ENCOUNTER — Encounter: Payer: Self-pay | Admitting: Podiatry

## 2017-08-23 DIAGNOSIS — M79675 Pain in left toe(s): Secondary | ICD-10-CM

## 2017-08-23 DIAGNOSIS — B351 Tinea unguium: Secondary | ICD-10-CM | POA: Diagnosis not present

## 2017-08-23 DIAGNOSIS — M79674 Pain in right toe(s): Secondary | ICD-10-CM | POA: Diagnosis not present

## 2017-08-26 NOTE — Progress Notes (Signed)
Subjective:   Patient ID: Kimberly Hardin, female   DOB: 67 y.o.   MRN: [id number]   HPI Patient presents with nail disease 1-5 both feet that are thick brittle and they can become painful in the corners with ambulation   ROS      Objective:  Physical Exam  Neurovascular status intact with yellow brittle nailbeds 1-5 both feet that are painful     Assessment:  Chronic mycotic nail infection with pain 1-5 both feet     Plan:  Debride painful nailbeds 1-5 both feet with no iatrogenic bleeding noted

## 2017-11-29 ENCOUNTER — Ambulatory Visit: Payer: Medicare Other | Admitting: Podiatry

## 2018-01-17 ENCOUNTER — Ambulatory Visit: Payer: Medicare Other | Admitting: Podiatry

## 2018-01-29 ENCOUNTER — Other Ambulatory Visit: Payer: Self-pay | Admitting: Interventional Cardiology

## 2018-02-23 NOTE — Progress Notes (Signed)
Cardiology Office Note:    Date:  02/25/2018   ID:  Milas Kocher, DOB [birth year], MRN [id number]  PCP:  Rodrigo Ran, MD  Cardiologist:  Lesleigh Noe, MD   Referring MD: Rodrigo Ran, MD   Chief Complaint  Patient presents with  . Coronary Artery Disease  . Shortness of Breath    History of Present Illness:    Kimberly Hardin is a 67 y.o. female with a hx of with history of coronary disease with prior LAD and right coronary stents as well as history of in-stent restenosis, chronic diastolic heart failure, diabetes mellitus, morbid obesity, history of acute diastolic heart failure, and anxiety disorder.  She and her husband Casimiro Needle have been under stress because of severe illness affecting her father-in-law.  He is currently hospitalized and seems to be transitioning to death.  He has been in and out of the hospital since mid summer.  Because of this, the patient states she has not been able to exercise or follow her diet as recommended.  She is for the most part sedentary.  Not able to exercise because of her weight and orthopedic issues.  She uses a walker.  She is short of breath with ambulating into the room today.  She denies episodes of chest pain.  She is not having orthopnea or lower extremity swelling.  She is statin intolerant.  She did start a PCSK9 via Dr. Waynard Edwards but has not been able to afford it.  She denies neurological symptoms.  She has not had syncope or palpitations she is compliant with her current medical regimen.   Past Medical History:  Diagnosis Date  . Allergic rhinitis   . Anemia   . Anxiety    PANIC ATTACKS  . Asthma   . Constipation   . Diabetes mellitus without complication (HCC)   . History of kidney stones   . History of renal failure   . Hyperlipidemia   . Hypertension   . MI (myocardial infarction) (HCC)    2 stents  . Neuropathy   . Obesity   . OSA (obstructive sleep apnea)    USES NASAL PRONGS    Past Surgical History:    Procedure Laterality Date  . CORONARY ANGIOPLASTY  2011/ 2012   CARDIAC STENTS X4  . CYSTOSCOPY W/ URETERAL STENT PLACEMENT Bilateral 08/12/2012   Procedure: CYSTOSCOPY WITH RETROGRADE PYELOGRAM/URETERAL STENT PLACEMENT;  Surgeon: Sebastian Ache, MD;  Location: [facility];  Service: Urology;  Laterality: Bilateral;  . CYSTOSCOPY WITH RETROGRADE PYELOGRAM, URETEROSCOPY AND STENT PLACEMENT Bilateral 10/15/2012   Procedure: BILATERAL URETERSCOPIC STONE MANIPULATION, LEFT URETERAL STONE BASKETRY, BILATERAL RETROGRADES AND STENT EXCHANGECHANGE;  Surgeon: Sebastian Ache, MD;  Location: [facility];  Service: Urology;  Laterality: Bilateral;  . EYE SURGERY  3 years ago   laser surgery  . HOLMIUM LASER APPLICATION Bilateral 10/15/2012   Procedure: HOLMIUM LASER APPLICATION;  Surgeon: Sebastian Ache, MD;  Location: [facility];  Service: Urology;  Laterality: Bilateral;  . LEFT HEART CATHETERIZATION WITH CORONARY ANGIOGRAM N/A 11/27/2012   Procedure: LEFT HEART CATHETERIZATION WITH CORONARY ANGIOGRAM;  Surgeon: Lesleigh Noe, MD;  Location: [facility] CATH LAB;  Service: Cardiovascular;  Laterality: N/A;    Current Medications: Current Meds  Medication Sig  . APPLE CIDER VINEGAR PO Take two (2) teaspoons by mouth daily.  Marland Kitchen aspirin EC 81 MG tablet Take 1 tablet (81 mg total) by mouth daily.  . Black Currant Seed Oil 500 MG CAPS Take 500 mg by mouth daily.  Marland Kitchen  carvedilol (COREG) 25 MG tablet Take 1 tablet (25 mg total) by mouth 2 (two) times daily.  . Cholecalciferol (D3 DOTS) 2000 UNITS TBDP Take 2,000 Units by mouth daily.  . clindamycin (CLEOCIN) 150 MG capsule   . Coenzyme Q10 (COQ10) 200 MG CAPS Take 200 mg by mouth daily.  Marland Kitchen Hawthorn 565 MG CAPS Take 565 mg by mouth 2 (two) times daily.  Marland Kitchen HUMALOG KWIKPEN 100 UNIT/ML KiwkPen Inject 14 Units into the skin 3 (three) times daily.   . insulin glargine (LANTUS) 100 UNIT/ML injection Inject 24 Units into the skin 2 (two) times daily.   Marland Kitchen LORazepam (ATIVAN) 0.5 MG tablet  Take 0.5 mg by mouth 3 (three) times daily as needed for anxiety.  . Multiple Vitamin (MULTIVITAMIN) capsule Take 1 capsule by mouth daily.  . nitroGLYCERIN (NITROSTAT) 0.4 MG SL tablet TAKE 1 TABLET UNDER THE TONGUE AS NEEDEDFOR CHEST PAIN (MAY REPEAT EVERY 5 MIN X3 DOSES, CALL 911 IF NO RELIEF.)  . Omega-3 Fatty Acids (FISH OIL) 1000 MG CAPS Take 1 capsule by mouth daily.  . vitamin B-12 (CYANOCOBALAMIN) 1000 MCG tablet Take 1,000 mcg by mouth daily.     Allergies:   Ace inhibitors; Flagyl [metronidazole]; Ibuprofen; Iodine; Lasix [furosemide]; Levaquin [levofloxacin in d5w]; Minocycline; Morphine and related; Nickel; Prednisone; Statins; Sudafed [pseudoephedrine hcl]; Toujeo solostar [insulin glargine]; Tylenol [acetaminophen]; Valium [diazepam]; Zoloft [sertraline hcl]; Darvon [propoxyphene hcl]; Librium [chlordiazepoxide]; Fluconazole; Other; Penicillins; and Betadine [povidone iodine]   Social History   Socioeconomic History  . Marital status: Married    Spouse name: Casimiro Needle  . Number of children: Not on file  . Years of education: Not on file  . Highest education level: Not on file  Occupational History  . Occupation: Engineering geologist [employer]    Employer: UNEMPLOYED  Social Needs  . Financial resource strain: Not on file  . Food insecurity:    Worry: Not on file    Inability: Not on file  . Transportation needs:    Medical: Not on file    Non-medical: Not on file  Tobacco Use  . Smoking status: Never Smoker  . Smokeless tobacco: Never Used  Substance and Sexual Activity  . Alcohol use: No  . Drug use: No  . Sexual activity: Not on file  Lifestyle  . Physical activity:    Days per week: Not on file    Minutes per session: Not on file  . Stress: Not on file  Relationships  . Social connections:    Talks on phone: Not on file    Gets together: Not on file    Attends religious service: Not on file    Active member of club or organization: Not on file    Attends meetings of clubs  or organizations: Not on file    Relationship status: Not on file  Other Topics Concern  . Not on file  Social History Narrative  . Not on file     Family History: The patient's family history includes Breast cancer in her mother; COPD in her mother; Diabetes in her father, paternal grandfather, paternal grandmother, and sister; Heart failure in her father; Stomach cancer in her maternal grandfather.  ROS:   Please see the history of present illness.    [facility] spotted fever requiring multiple courses of antibiotics over the summer.  Also identified and allergy to dairy products which was causing bloating and diarrhea.  All other systems reviewed and are negative.  EKGs/Labs/Other Studies Reviewed:    The  following studies were reviewed today: No recent hospitalizations, functional, or imaging reports were available for review and none have occurred since 2016.  EKG:  EKG is  ordered today.  The ekg ordered today demonstrates sinus bradycardia, 55 bpm, with normal PR interval, left atrial abnormality, LVH with strain.  Recent Labs: No results found for requested labs within last 8760 hours.  Recent Lipid Panel    Component Value Date/Time   CHOL 160 08/14/2012 0205   TRIG 78 08/14/2012 0205   HDL 40 08/14/2012 0205   CHOLHDL 4.0 08/14/2012 0205   VLDL 16 08/14/2012 0205   LDLCALC 104 (H) 08/14/2012 0205    Physical Exam:    VS:  BP (!) 160/72   Pulse (!) 55   Ht 4\' 10"  (1.473 m)   Wt 249 lb 3.2 oz (113 kg)   SpO2 97%   BMI 52.08 kg/m     Wt Readings from Last 3 Encounters:  02/25/18 249 lb 3.2 oz (113 kg)  05/24/17 247 lb (112 kg)  01/31/17 239 lb 9.6 oz (108.7 kg)     GEN:  Well nourished, well developed in no acute distress HEENT: Normal NECK: No JVD. LYMPHATICS: No lymphadenopathy CARDIAC: RRR, no murmur, no gallop, no  edema. VASCULAR: 2+ bilateral radial and carotid pulses.  No bruits. RESPIRATORY:  Clear to auscultation without rales, wheezing or  rhonchi  ABDOMEN: Soft, non-tender, non-distended, No pulsatile mass, MUSCULOSKELETAL: No deformity  SKIN: Warm and dry NEUROLOGIC:  Alert and oriented x 3 PSYCHIATRIC:  Normal affect   ASSESSMENT:    1. Coronary artery disease involving native coronary artery of native heart with angina pectoris (HCC)   2. Chronic diastolic HF (heart failure) (HCC)   3. Essential hypertension   4. Type 2 diabetes mellitus with other diabetic kidney complication, with long-term current use of insulin (HCC)   5. Obstructive sleep apnea   6. Hyperlipidemia LDL goal <70    PLAN:    In order of problems listed above:  1. Rediscussion of secondary risk prevention in great detail.  She is lacking in physical activity/weight loss/and proper diet. 2. No evidence of volume overload 3. Target blood pressure 130/80 mmHg or less.  Low-salt diet reiterated. 4. In addition to targeting a hemoglobin A1c less than 7 by conventional means, additional therapies to decrease CV risk should be considered.  SGLT2 inhibitor therapy ("-flozin" e.g. Invokana) decreases risk of heart failure development.  GLP-1 agonist therapy ("-glutide" agents e.g. Trulicity; or "-atide" agents e.g.Byetta) to reduce vascular events. 5. Encouraged compliance with CPAP. 6. LDL target should be less than 70.  Overall education and awareness concerning primary/secondary risk prevention was discussed in detail: LDL less than 70, hemoglobin A1c less than 7, blood pressure target less than 130/80 mmHg, >150 minutes of moderate aerobic activity per week, avoidance of smoking, weight control (via diet and exercise), and continued surveillance/management of/for obstructive sleep apnea.  We did express overall disappointment with her current status and strongly encouraged attempts at increasing aerobic activity and weight loss.  She is at high risk for recurrent vascular events.  Greater than 50% of the time during this office visit was spent in  education, counseling, and coordination of care related to underlying disease process and testing as outlined.     Medication Adjustments/Labs and Tests Ordered: Current medicines are reviewed at length with the patient today.  Concerns regarding medicines are outlined above.  Orders Placed This Encounter  Procedures  . EKG 12-Lead  No orders of the defined types were placed in this encounter.   Patient Instructions  Medication Instructions:  Your physician recommends that you continue on your current medications as directed. Please refer to the Current Medication list given to you today.  If you need a refill on your cardiac medications before your next appointment, please call your pharmacy.   Lab work: None If you have labs (blood work) drawn today and your tests are completely normal, you will receive your results only by: Marland Kitchen MyChart Message (if you have MyChart) OR . A paper copy in the mail If you have any lab test that is abnormal or we need to change your treatment, we will call you to review the results.  Testing/Procedures: None  Follow-Up: At [facility], you and your health needs are our priority.  As part of our continuing mission to provide you with exceptional heart care, we have created designated Provider Care Teams.  These Care Teams include your primary Cardiologist (physician) and Advanced Practice Providers (APPs -  Physician Assistants and Nurse Practitioners) who all work together to provide you with the care you need, when you need it. You will need a follow up appointment in 12 months.  Please call our office 2 months in advance to schedule this appointment.  You may see Lesleigh Noe, MD or one of the following Advanced Practice Providers on your designated Care Team:   Norma Fredrickson, NP Nada Boozer, NP . Georgie Chard, NP  Any Other Special Instructions Will Be Listed Below (If Applicable).       Signed, Lesleigh Noe, MD  02/25/2018  1:17 PM    [facility]

## 2018-02-25 ENCOUNTER — Encounter: Payer: Self-pay | Admitting: Interventional Cardiology

## 2018-02-25 ENCOUNTER — Ambulatory Visit: Payer: Medicare Other | Admitting: Interventional Cardiology

## 2018-02-25 VITALS — BP 160/72 | HR 55 | Ht <= 58 in | Wt 249.2 lb

## 2018-02-25 DIAGNOSIS — G4733 Obstructive sleep apnea (adult) (pediatric): Secondary | ICD-10-CM

## 2018-02-25 DIAGNOSIS — E1129 Type 2 diabetes mellitus with other diabetic kidney complication: Secondary | ICD-10-CM | POA: Diagnosis not present

## 2018-02-25 DIAGNOSIS — I10 Essential (primary) hypertension: Secondary | ICD-10-CM | POA: Diagnosis not present

## 2018-02-25 DIAGNOSIS — I25119 Atherosclerotic heart disease of native coronary artery with unspecified angina pectoris: Secondary | ICD-10-CM

## 2018-02-25 DIAGNOSIS — E785 Hyperlipidemia, unspecified: Secondary | ICD-10-CM

## 2018-02-25 DIAGNOSIS — I5032 Chronic diastolic (congestive) heart failure: Secondary | ICD-10-CM | POA: Diagnosis not present

## 2018-02-25 DIAGNOSIS — Z794 Long term (current) use of insulin: Secondary | ICD-10-CM

## 2018-02-25 NOTE — Patient Instructions (Signed)

## 2018-04-25 ENCOUNTER — Ambulatory Visit: Payer: Medicare Other | Admitting: Podiatry

## 2018-04-28 ENCOUNTER — Other Ambulatory Visit: Payer: Self-pay | Admitting: Interventional Cardiology

## 2018-10-04 ENCOUNTER — Emergency Department (HOSPITAL_COMMUNITY)
Admission: EM | Admit: 2018-10-04 | Discharge: 2018-10-15 | Disposition: E | Payer: Medicare Other | Attending: Emergency Medicine | Admitting: Emergency Medicine

## 2018-10-04 DIAGNOSIS — Z955 Presence of coronary angioplasty implant and graft: Secondary | ICD-10-CM | POA: Insufficient documentation

## 2018-10-04 DIAGNOSIS — R140 Abdominal distension (gaseous): Secondary | ICD-10-CM | POA: Diagnosis not present

## 2018-10-04 DIAGNOSIS — N182 Chronic kidney disease, stage 2 (mild): Secondary | ICD-10-CM | POA: Insufficient documentation

## 2018-10-04 DIAGNOSIS — I130 Hypertensive heart and chronic kidney disease with heart failure and stage 1 through stage 4 chronic kidney disease, or unspecified chronic kidney disease: Secondary | ICD-10-CM | POA: Diagnosis not present

## 2018-10-04 DIAGNOSIS — I469 Cardiac arrest, cause unspecified: Secondary | ICD-10-CM | POA: Diagnosis not present

## 2018-10-04 DIAGNOSIS — E1122 Type 2 diabetes mellitus with diabetic chronic kidney disease: Secondary | ICD-10-CM | POA: Insufficient documentation

## 2018-10-04 DIAGNOSIS — Z794 Long term (current) use of insulin: Secondary | ICD-10-CM | POA: Diagnosis not present

## 2018-10-04 DIAGNOSIS — R4182 Altered mental status, unspecified: Secondary | ICD-10-CM | POA: Diagnosis present

## 2018-10-04 DIAGNOSIS — J45909 Unspecified asthma, uncomplicated: Secondary | ICD-10-CM | POA: Insufficient documentation

## 2018-10-04 DIAGNOSIS — Z7982 Long term (current) use of aspirin: Secondary | ICD-10-CM | POA: Insufficient documentation

## 2018-10-04 DIAGNOSIS — I5032 Chronic diastolic (congestive) heart failure: Secondary | ICD-10-CM | POA: Insufficient documentation

## 2018-10-04 DIAGNOSIS — I2510 Atherosclerotic heart disease of native coronary artery without angina pectoris: Secondary | ICD-10-CM | POA: Insufficient documentation

## 2018-10-04 DIAGNOSIS — Z79899 Other long term (current) drug therapy: Secondary | ICD-10-CM | POA: Insufficient documentation

## 2018-10-04 MED ORDER — EPINEPHRINE 1 MG/10ML IJ SOSY
PREFILLED_SYRINGE | INTRAMUSCULAR | Status: AC | PRN
  Administered 2018-10-04 (×4): 1 mg via INTRAVENOUS

## 2018-10-04 MED ORDER — ROCURONIUM BROMIDE 50 MG/5ML IV SOLN
INTRAVENOUS | Status: AC | PRN
  Administered 2018-10-04: 100 mg via INTRAVENOUS

## 2018-10-04 MED ORDER — SODIUM BICARBONATE 8.4 % IV SOLN
INTRAVENOUS | Status: AC | PRN
  Administered 2018-10-04: 50 meq via INTRAVENOUS

## 2018-10-04 NOTE — ED Triage Notes (Signed)
Pt reportedly c/o CP to her husband and collapsed.  Pts husband began CPR prior to medics arriving.  Pulses were brought back twice by EMS.  Pt was in PEA for 30-40 min.  CPR had been in progress for 55 min prior to arriving to hospital.  CPR continues

## 2018-10-04 NOTE — Code Documentation (Signed)
Patient time of death occurred at 2208-08-15 Dr. Billy Fischer

## 2018-10-04 NOTE — ED Provider Notes (Signed)
Procedure Name: Intubation Date/Time: 10/07/2018 10:17 PM Performed by: Lonzo Candy, MD Pre-anesthesia Checklist: Patient identified, Emergency Drugs available, Suction available, Patient being monitored and Timeout performed Oxygen Delivery Method: Non-rebreather mask Laryngoscope Size: Glidescope and 4 Grade View: Grade II Tube size: 7.0 mm Number of attempts: 1 Airway Equipment and Method: Rigid stylet Placement Confirmation: ETT inserted through vocal cords under direct vision,  CO2 detector and Breath sounds checked- equal and bilateral Secured at: 23 cm Tube secured with: ETT holder     Patient emergently intubated for airway failure while actively receiving CPR    Lonzo Candy, MD 10/12/2018 2222    Gareth Morgan, MD 10/06/18 [phone]

## 2018-10-04 NOTE — ED Provider Notes (Signed)
[facility] Note   CSN: [id number] Arrival date & time: 10/12/2018  2141    History   Chief Complaint Chief Complaint  Patient presents with  . Cardiac Arrest    HPI Milas KocherMarion Duguay is a 68 y.o. female.     HPI   68 year old female presents with coronary artery disease, chronic diastolic heart failure, diabetes, morbid obesity, diastolic heart failure and anxiety who presents with cardiac arrest.  Patient had complained of chest pain for approximately an hour, and was present with calling 911, she became unresponsive and pulseless.  Husband began bystander CPR.  On arrival, they found her to be in PEA arrest.  Prior to arrival to the emergency department, she has had 55 minutes of CPR, with the exception of brief ROSC twice, and has been in continuous PEA arrest for the last 25min. She had received epinephrine. Pupils are fixed and dilated.  Past Medical History:  Diagnosis Date  . Allergic rhinitis   . Anemia   . Anxiety    PANIC ATTACKS  . Asthma   . Constipation   . Diabetes mellitus without complication (HCC)   . History of kidney stones   . History of renal failure   . Hyperlipidemia   . Hypertension   . MI (myocardial infarction) (HCC)    2 stents  . Neuropathy   . Obesity   . OSA (obstructive sleep apnea)    USES NASAL PRONGS    Patient Active Problem List   Diagnosis Date Noted  . Epiretinal membrane (ERM) of right eye 07/28/2015  . 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 Moderate nonproliferative diabetic retinopathy of left eye (HCC) 02/03/2015  . HLD (hyperlipidemia) 12/02/2014  . CKD (chronic kidney disease), stage II 12/02/2014  . Chronic diastolic HF (heart failure) (HCC)   . Obstructive sleep apnea 03/10/2013  . Age-related nuclear cataract of both eyes 12/19/2012  . Abnormal nuclear stress test 11/27/2012    Class:  Chronic  . Vitreous hemorrhage (HCC) 11/20/2012  . Anemia, iron deficiency 08/17/2012  . Coronary artery disease involving native coronary artery of native heart with angina pectoris (HCC) 08/14/2012    Class: Chronic  . Unspecified constipation 08/12/2012  . Bilateral kidney stones 08/12/2012  . UTI (lower urinary tract infection) 08/12/2012  . DM2 (diabetes mellitus, type 2) (HCC) 08/12/2012  . HTN (hypertension) 08/12/2012  . Hyperkalemia, diminished renal excretion 08/12/2012    Past Surgical History:  Procedure Laterality Date  . CORONARY ANGIOPLASTY  2011/ 2012   CARDIAC STENTS X4  . CYSTOSCOPY W/ URETERAL STENT PLACEMENT Bilateral 08/12/2012   Procedure: CYSTOSCOPY WITH RETROGRADE PYELOGRAM/URETERAL STENT PLACEMENT;  Surgeon: Sebastian Acheheodore Manny, MD;  Location: [facility];  Service: Urology;  Laterality: Bilateral;  . CYSTOSCOPY WITH RETROGRADE PYELOGRAM, URETEROSCOPY AND STENT PLACEMENT Bilateral 10/15/2012   Procedure: BILATERAL URETERSCOPIC STONE MANIPULATION, LEFT URETERAL STONE BASKETRY, BILATERAL RETROGRADES AND STENT EXCHANGECHANGE;  Surgeon: Sebastian Acheheodore Manny, MD;  Location: [facility];  Service: Urology;  Laterality: Bilateral;  . EYE SURGERY  3 years ago   laser surgery  . HOLMIUM LASER APPLICATION Bilateral 10/15/2012   Procedure: HOLMIUM LASER APPLICATION;  Surgeon: Sebastian Acheheodore Manny, MD;  Location: [facility];  Service: Urology;  Laterality: Bilateral;  . LEFT HEART CATHETERIZATION WITH CORONARY ANGIOGRAM N/A 11/27/2012   Procedure: LEFT HEART CATHETERIZATION WITH CORONARY ANGIOGRAM;  Surgeon: Lesleigh NoeHenry W Smith III, MD;  [facility];  Service: Cardiovascular;  Laterality: N/A;     OB History   No obstetric history on file.      Home Medications    Prior to Admission medications   Medication Sig Start Date End Date Taking? Authorizing Provider  APPLE CIDER VINEGAR PO Take two (2) teaspoons by mouth daily.    [provider]  aspirin EC 81 MG tablet Take 1 tablet (81 mg  total) by mouth daily. 01/31/17   Belva Crome, MD  Black Currant Seed Oil 500 MG CAPS Take 500 mg by mouth daily.    [provider]  carvedilol (COREG) 25 MG tablet TAKE 1 TABLET BY MOUTH TWICE DAILY 04/28/18   Belva Crome, MD  Cholecalciferol (D3 DOTS) 2000 UNITS TBDP Take 2,000 Units by mouth daily.    [provider]  clindamycin (CLEOCIN) 150 MG capsule  02/12/18   [provider]  Coenzyme Q10 (COQ10) 200 MG CAPS Take 200 mg by mouth daily.    [provider]  Hawthorn 565 MG CAPS Take 565 mg by mouth 2 (two) times daily.    [provider]  HUMALOG KWIKPEN 100 UNIT/ML KiwkPen Inject 14 Units into the skin 3 (three) times daily.  12/08/15   [provider]  insulin glargine (LANTUS) 100 UNIT/ML injection Inject 24 Units into the skin 2 (two) times daily.     [provider]  LORazepam (ATIVAN) 0.5 MG tablet Take 0.5 mg by mouth 3 (three) times daily as needed for anxiety.    [provider]  Multiple Vitamin (MULTIVITAMIN) capsule Take 1 capsule by mouth daily.    [provider]  nitroGLYCERIN (NITROSTAT) 0.4 MG SL tablet TAKE 1 TABLET UNDER THE TONGUE AS NEEDEDFOR CHEST PAIN (MAY REPEAT EVERY 5 MIN X3 DOSES, CALL 911 IF NO RELIEF.) 01/29/18   Belva Crome, MD  Omega-3 Fatty Acids (FISH OIL) 1000 MG CAPS Take 1 capsule by mouth daily.    [provider]  vitamin B-12 (CYANOCOBALAMIN) 1000 MCG tablet Take 1,000 mcg by mouth daily.    [provider]    Family History Family History  Problem Relation Age of Onset  . Diabetes Father   . Heart failure Father   . Breast cancer Mother   . COPD Mother   . Diabetes Sister   . Stomach cancer Maternal Grandfather   . Diabetes Paternal Grandmother   . Diabetes Paternal Grandfather     Social History Social History   Tobacco Use  . Smoking status: Never Smoker  . Smokeless tobacco: Never Used  Substance Use Topics  . Alcohol use: No   . Drug use: No     Allergies   Ace inhibitors, Flagyl [metronidazole], Ibuprofen, Iodine, Lasix [furosemide], Levaquin [levofloxacin in d5w], Minocycline, Morphine and related, Nickel, Prednisone, Statins, Sudafed [pseudoephedrine hcl], Toujeo solostar [insulin glargine], Tylenol [acetaminophen], Valium [diazepam], Zoloft [sertraline hcl], Darvon [propoxyphene hcl], Librium [chlordiazepoxide], Fluconazole, Other, Penicillins, and Betadine [povidone iodine]   Review of Systems Review of Systems  Unable to perform ROS: Patient unresponsive  Cardiovascular: Positive for chest pain.     Physical Exam Updated Vital Signs BP (!) 87/13   Resp (!) 0   Ht 4\' 10"  (1.473 m)   Wt 113 kg   BMI 52.07 kg/m   Physical Exam Vitals signs and nursing note reviewed.  Constitutional:      Appearance: She is obese. She is ill-appearing and toxic-appearing.  HENT:     Head: Normocephalic and atraumatic.  Nose: Nose normal.  Eyes:     Comments: Pupils 5mm, nonreactive  Cardiovascular:     Comments: pulseless Pulmonary:     Comments: King airway in place Abdominal:     General: There is distension.  Skin:    Comments: cool  Neurological:     Mental Status: She is unresponsive.     GCS: GCS eye subscore is 1. GCS verbal subscore is 1. GCS motor subscore is 1.      ED Treatments / Results  Labs (all labs ordered are listed, but only abnormal results are displayed) Labs Reviewed - No data to display  EKG None  Radiology No results found.  Procedures .Critical Care Performed by: Alvira MondaySchlossman, Zephaniah Lubrano, MD Authorized by: Alvira MondaySchlossman, Lydiann Bonifas, MD   Critical care provider statement:    Critical care time (minutes):  40   Critical care was necessary to treat or prevent imminent or life-threatening deterioration of the following conditions:  Cardiac failure and circulatory failure Comments:     CPR    (including critical care time)  Medications Ordered in ED Medications   EPINEPHrine (ADRENALIN) 1 MG/10ML injection (1 mg Intravenous Given 10/02/2018 2207)  sodium bicarbonate injection (50 mEq Intravenous Given 10/02/2018 2140)  rocuronium (ZEMURON) injection (100 mg Intravenous Given 09/16/2018 2149)     Initial Impression / Assessment and Plan / ED Course  I have reviewed the triage vital signs and the nursing notes.  Pertinent labs & imaging results that were available during my care of the patient were reviewed by me and considered in my medical decision making (see chart for details).        68 year old female presents with coronary artery disease, chronic diastolic heart failure, diabetes, morbid obesity, diastolic heart failure and anxiety who presents with cardiac arrest, with CPR for 55 minutes and 2 brief ROSC prior to arrival with most recent 25 minutes ago.  On arrival to the ED, GCS 3, pupils dilated, pt in PEA arrest. CPR continued.  Concern on arrival for poor neurologic outcome, however continued CPR, gave bicarb, epinephrine and planned to intubate.  Patient with difficult intubation, and required multiple attempts by me with glidescope and bougie with BVM between attempts with continuous CPR.  CPR then halted and intubation obtained by Dr. Baird Lyonsasey using glidescope, please see his note for details.  After intubation, CPR was continued. On pulse check, she was in asystole. Given duration of CPR now with asystole and concern for poor meaningful neurologic recovery, we discussed as a team that there would be little benefit in continuing resuscitation.  Time of death 2210.  Discussed with family with Chaplain present. She will not be an ME case.  Final Clinical Impressions(s) / ED Diagnoses   Final diagnoses:  Cardiac arrest [facility](HCC)    ED Discharge Orders    None       Alvira MondaySchlossman, Mahogony Gilchrest, MD Sep 04, 2018 [phone]

## 2018-10-07 MED FILL — Medication: Qty: 1 | Status: AC

## 2018-10-15 DEATH — deceased

## 2019-07-16 DEATH — deceased
